# Patient Record
Sex: Male | Born: 1965 | Hispanic: Yes | Marital: Married | Smoking: Former smoker
Health system: Southern US, Community
[De-identification: ages and names within clinical notes are randomized; demographics above are authoritative.]

## PROBLEM LIST (undated history)

## (undated) DIAGNOSIS — E119 Type 2 diabetes mellitus without complications: Secondary | ICD-10-CM

## (undated) DIAGNOSIS — F419 Anxiety disorder, unspecified: Secondary | ICD-10-CM

## (undated) DIAGNOSIS — J020 Streptococcal pharyngitis: Secondary | ICD-10-CM

## (undated) DIAGNOSIS — M1990 Unspecified osteoarthritis, unspecified site: Secondary | ICD-10-CM

## (undated) DIAGNOSIS — R112 Nausea with vomiting, unspecified: Secondary | ICD-10-CM

## (undated) DIAGNOSIS — Z9889 Other specified postprocedural states: Secondary | ICD-10-CM

## (undated) DIAGNOSIS — G4730 Sleep apnea, unspecified: Secondary | ICD-10-CM

## (undated) HISTORY — PX: NECK SURGERY: SHX720

## (undated) HISTORY — PX: WISDOM TOOTH EXTRACTION: SHX21

## (undated) HISTORY — PX: HERNIA REPAIR: SHX51

---

## 2000-08-28 ENCOUNTER — Encounter: Payer: Self-pay | Admitting: Specialist

## 2000-08-28 ENCOUNTER — Ambulatory Visit (HOSPITAL_COMMUNITY): Admission: RE | Admit: 2000-08-28 | Discharge: 2000-08-28 | Payer: Self-pay | Admitting: Specialist

## 2011-08-26 ENCOUNTER — Other Ambulatory Visit (HOSPITAL_COMMUNITY): Payer: Self-pay | Admitting: Orthopaedic Surgery

## 2011-09-08 ENCOUNTER — Encounter (HOSPITAL_COMMUNITY): Payer: Self-pay | Admitting: Pharmacy Technician

## 2011-09-09 ENCOUNTER — Encounter (HOSPITAL_COMMUNITY): Payer: Self-pay

## 2011-09-09 ENCOUNTER — Encounter (HOSPITAL_COMMUNITY)
Admission: RE | Admit: 2011-09-09 | Discharge: 2011-09-09 | Disposition: A | Discharge status: Home / Self Care | Payer: 59 | Source: Ambulatory Visit | Attending: Orthopaedic Surgery | Admitting: Orthopaedic Surgery

## 2011-09-09 HISTORY — DX: Other specified postprocedural states: Z98.890

## 2011-09-09 HISTORY — DX: Unspecified osteoarthritis, unspecified site: M19.90

## 2011-09-09 HISTORY — DX: Nausea with vomiting, unspecified: R11.2

## 2011-09-09 LAB — URINALYSIS, ROUTINE W REFLEX MICROSCOPIC
Glucose, UA: NEGATIVE mg/dL
Hgb urine dipstick: NEGATIVE
Ketones, ur: NEGATIVE mg/dL
Protein, ur: NEGATIVE mg/dL
Urobilinogen, UA: 0.2 mg/dL (ref 0.0–1.0)

## 2011-09-09 LAB — CBC
HCT: 45.4 % (ref 39.0–52.0)
Hemoglobin: 16.1 g/dL (ref 13.0–17.0)
MCHC: 35.5 g/dL (ref 30.0–36.0)
MCV: 85.5 fL (ref 78.0–100.0)
WBC: 8.3 10*3/uL (ref 4.0–10.5)

## 2011-09-09 LAB — BASIC METABOLIC PANEL
BUN: 16 mg/dL (ref 6–23)
CO2: 27 mEq/L (ref 19–32)
Calcium: 9.8 mg/dL (ref 8.4–10.5)
Creatinine, Ser: 0.94 mg/dL (ref 0.50–1.35)
GFR calc non Af Amer: >90 mL/min (ref >90)
Glucose, Bld: 90 mg/dL (ref 70–99)

## 2011-09-09 LAB — APTT: aPTT: 35 seconds (ref 24–37)

## 2011-09-09 NOTE — Patient Instructions (Signed)
[address] T[address] Vences  09/09/2011   Your procedure is scheduled on:  09/19/2011 0730am-0900am  Report to [facility] at 0530 AM.  Call this number if you have problems the morning of surgery: [phone]   Remember:   Do not eat food:After Midnight.  May have clear liquids:until Midnight .    Take these medicines the morning of surgery with A SIP OF WATER:    Do not wear jewelry,   Do not wear lotions, powders, or perfumes. .    Do not bring valuables to the hospital.  Contacts, dentures or bridgework may not be worn into surgery.  Leave suitcase in the car. After surgery it may be brought to your room.  For patients admitted to the hospital, checkout time is 11:00 AM the day of discharge.      Special Instructions: CHG Shower Use Special Wash: 1/2 bottle night before surgery and 1/2 bottle morning of surgery. shower chin to toes  With CHG.  Wash face and private parts with regular soap.     Please read over the following fact sheets that you were given: MRSA Information, Blood Transfusion Fact Sheet, coughing and deep breathing exercises, leg exercises

## 2011-09-17 DIAGNOSIS — J020 Streptococcal pharyngitis: Secondary | ICD-10-CM

## 2011-09-17 HISTORY — DX: Streptococcal pharyngitis: J02.0

## 2011-09-19 ENCOUNTER — Encounter (HOSPITAL_COMMUNITY): Payer: Self-pay | Admitting: *Deleted

## 2011-09-19 ENCOUNTER — Ambulatory Visit (HOSPITAL_COMMUNITY): Payer: 59

## 2011-09-19 ENCOUNTER — Inpatient Hospital Stay (HOSPITAL_COMMUNITY)
Admission: RE | Admit: 2011-09-19 | Discharge: 2011-09-22 | DRG: 470 | Disposition: A | Discharge status: Home Health | Payer: 59 | Source: Ambulatory Visit | Attending: Orthopaedic Surgery | Admitting: Orthopaedic Surgery

## 2011-09-19 ENCOUNTER — Encounter (HOSPITAL_COMMUNITY): Payer: Self-pay | Admitting: Anesthesiology

## 2011-09-19 ENCOUNTER — Encounter (HOSPITAL_COMMUNITY): Payer: Self-pay

## 2011-09-19 ENCOUNTER — Ambulatory Visit (HOSPITAL_COMMUNITY): Payer: 59 | Admitting: Anesthesiology

## 2011-09-19 ENCOUNTER — Encounter (HOSPITAL_COMMUNITY)
Admission: RE | Disposition: A | Discharge status: Home Health | Payer: Self-pay | Source: Ambulatory Visit | Attending: Orthopaedic Surgery

## 2011-09-19 DIAGNOSIS — M1610 Unilateral primary osteoarthritis, unspecified hip: Principal | ICD-10-CM | POA: Diagnosis present

## 2011-09-19 DIAGNOSIS — J020 Streptococcal pharyngitis: Secondary | ICD-10-CM | POA: Diagnosis present

## 2011-09-19 DIAGNOSIS — F17200 Nicotine dependence, unspecified, uncomplicated: Secondary | ICD-10-CM | POA: Diagnosis present

## 2011-09-19 DIAGNOSIS — M169 Osteoarthritis of hip, unspecified: Secondary | ICD-10-CM

## 2011-09-19 DIAGNOSIS — Z01812 Encounter for preprocedural laboratory examination: Secondary | ICD-10-CM

## 2011-09-19 HISTORY — DX: Streptococcal pharyngitis: J02.0

## 2011-09-19 HISTORY — PX: TOTAL HIP ARTHROPLASTY: SHX124

## 2011-09-19 LAB — TYPE AND SCREEN
ABO/RH(D): O POS
Antibody Screen: NEGATIVE

## 2011-09-19 LAB — ABO/RH: ABO/RH(D): O POS

## 2011-09-19 SURGERY — ARTHROPLASTY, HIP, TOTAL, ANTERIOR APPROACH
Anesthesia: General | Site: Hip | Laterality: Left | Wound class: Clean

## 2011-09-19 MED ORDER — ONDANSETRON HCL 4 MG PO TABS
4.0000 mg | ORAL_TABLET | Freq: Four times a day (QID) | ORAL | Status: DC | PRN
  Filled 2011-09-19: qty 1

## 2011-09-19 MED ORDER — PROMETHAZINE HCL 25 MG/ML IJ SOLN: 6.2500 mg | INTRAMUSCULAR | Status: DC | PRN

## 2011-09-19 MED ORDER — ONDANSETRON HCL 4 MG/2ML IJ SOLN
INTRAMUSCULAR | Status: DC | PRN
  Administered 2011-09-19: 4 mg via INTRAVENOUS

## 2011-09-19 MED ORDER — HYDROMORPHONE HCL PF 1 MG/ML IJ SOLN
0.2500 mg | INTRAMUSCULAR | Status: DC | PRN
  Administered 2011-09-19 (×4): 0.5 mg via INTRAVENOUS

## 2011-09-19 MED ORDER — SODIUM CHLORIDE 0.9 % IV SOLN
INTRAVENOUS | Status: DC
  Administered 2011-09-19 – 2011-09-20 (×3): via INTRAVENOUS

## 2011-09-19 MED ORDER — ALUM & MAG HYDROXIDE-SIMETH 200-200-20 MG/5ML PO SUSP
30.0000 mL | ORAL | Status: DC | PRN
  Filled 2011-09-19: qty 30

## 2011-09-19 MED ORDER — FENTANYL CITRATE 0.05 MG/ML IJ SOLN
INTRAMUSCULAR | Status: DC | PRN
  Administered 2011-09-19: 100 ug via INTRAVENOUS
  Administered 2011-09-19 (×2): 50 ug via INTRAVENOUS

## 2011-09-19 MED ORDER — RIVAROXABAN 10 MG PO TABS
10.0000 mg | ORAL_TABLET | Freq: Every day | ORAL | Status: DC
  Administered 2011-09-20 – 2011-09-22 (×3): 10 mg via ORAL
  Filled 2011-09-19 (×3): qty 1

## 2011-09-19 MED ORDER — ACETAMINOPHEN 650 MG RE SUPP: 650.0000 mg | Freq: Four times a day (QID) | RECTAL | Status: DC | PRN

## 2011-09-19 MED ORDER — MORPHINE SULFATE (PF) 1 MG/ML IV SOLN
INTRAVENOUS | Status: DC
  Administered 2011-09-19: 11:00:00 via INTRAVENOUS
  Administered 2011-09-19: 1.5 mg via INTRAVENOUS
  Administered 2011-09-20: 3 mg via INTRAVENOUS
  Administered 2011-09-20: 09:00:00 via INTRAVENOUS
  Administered 2011-09-20: 9 mg via INTRAVENOUS
  Administered 2011-09-20 (×2): 3 mg via INTRAVENOUS
  Filled 2011-09-19: qty 25

## 2011-09-19 MED ORDER — ACETAMINOPHEN 325 MG PO TABS: 650.0000 mg | ORAL_TABLET | Freq: Four times a day (QID) | ORAL | Status: DC | PRN

## 2011-09-19 MED ORDER — LORATADINE 10 MG PO TABS
10.0000 mg | ORAL_TABLET | Freq: Every day | ORAL | Status: DC
  Administered 2011-09-19 – 2011-09-22 (×4): 10 mg via ORAL
  Filled 2011-09-19 (×4): qty 1

## 2011-09-19 MED ORDER — DIPHENHYDRAMINE HCL 12.5 MG/5ML PO ELIX
12.5000 mg | ORAL_SOLUTION | Freq: Four times a day (QID) | ORAL | Status: DC | PRN
  Filled 2011-09-19: qty 5

## 2011-09-19 MED ORDER — CISATRACURIUM BESYLATE 2 MG/ML IV SOLN
INTRAVENOUS | Status: DC | PRN
  Administered 2011-09-19: 6 mg via INTRAVENOUS

## 2011-09-19 MED ORDER — DOCUSATE SODIUM 100 MG PO CAPS
100.0000 mg | ORAL_CAPSULE | Freq: Two times a day (BID) | ORAL | Status: DC
  Administered 2011-09-20 – 2011-09-22 (×4): 100 mg via ORAL
  Filled 2011-09-19 (×7): qty 1

## 2011-09-19 MED ORDER — METHOCARBAMOL 100 MG/ML IJ SOLN
500.0000 mg | Freq: Four times a day (QID) | INTRAVENOUS | Status: DC | PRN
  Administered 2011-09-19 – 2011-09-20 (×2): 500 mg via INTRAVENOUS
  Filled 2011-09-19 (×2): qty 5

## 2011-09-19 MED ORDER — OXYCODONE HCL 5 MG PO TABS: 5.0000 mg | ORAL_TABLET | ORAL | Status: DC | PRN

## 2011-09-19 MED ORDER — DIPHENHYDRAMINE HCL 50 MG/ML IJ SOLN: 12.5000 mg | Freq: Four times a day (QID) | INTRAMUSCULAR | Status: DC | PRN

## 2011-09-19 MED ORDER — ACETAMINOPHEN 10 MG/ML IV SOLN
INTRAVENOUS | Status: AC
  Filled 2011-09-19: qty 100

## 2011-09-19 MED ORDER — LACTATED RINGERS IV SOLN: INTRAVENOUS | Status: DC

## 2011-09-19 MED ORDER — 0.9 % SODIUM CHLORIDE (POUR BTL) OPTIME
TOPICAL | Status: DC | PRN
  Administered 2011-09-19: 1000 mL

## 2011-09-19 MED ORDER — LACTATED RINGERS IV SOLN
INTRAVENOUS | Status: DC | PRN
  Administered 2011-09-19 (×3): via INTRAVENOUS

## 2011-09-19 MED ORDER — ONDANSETRON HCL 4 MG/2ML IJ SOLN: 4.0000 mg | Freq: Four times a day (QID) | INTRAMUSCULAR | Status: DC | PRN

## 2011-09-19 MED ORDER — SUCCINYLCHOLINE CHLORIDE 20 MG/ML IJ SOLN
INTRAMUSCULAR | Status: DC | PRN
  Administered 2011-09-19: 100 mg via INTRAVENOUS

## 2011-09-19 MED ORDER — MORPHINE SULFATE (PF) 1 MG/ML IV SOLN
INTRAVENOUS | Status: AC
  Filled 2011-09-19: qty 25

## 2011-09-19 MED ORDER — AZITHROMYCIN 250 MG PO TABS
250.0000 mg | ORAL_TABLET | Freq: Every day | ORAL | Status: DC
  Administered 2011-09-19 – 2011-09-22 (×4): 250 mg via ORAL
  Filled 2011-09-19 (×4): qty 1

## 2011-09-19 MED ORDER — NALOXONE HCL 0.4 MG/ML IJ SOLN: 0.4000 mg | INTRAMUSCULAR | Status: DC | PRN

## 2011-09-19 MED ORDER — EPHEDRINE SULFATE 50 MG/ML IJ SOLN
INTRAMUSCULAR | Status: DC | PRN
  Administered 2011-09-19: 10 mg via INTRAVENOUS

## 2011-09-19 MED ORDER — ACETAMINOPHEN 10 MG/ML IV SOLN
INTRAVENOUS | Status: DC | PRN
  Administered 2011-09-19: 1000 mg via INTRAVENOUS

## 2011-09-19 MED ORDER — METOCLOPRAMIDE HCL 10 MG PO TABS: 5.0000 mg | ORAL_TABLET | Freq: Three times a day (TID) | ORAL | Status: DC | PRN

## 2011-09-19 MED ORDER — MIDAZOLAM HCL 5 MG/5ML IJ SOLN
INTRAMUSCULAR | Status: DC | PRN
  Administered 2011-09-19: 2 mg via INTRAVENOUS

## 2011-09-19 MED ORDER — HETASTARCH-ELECTROLYTES 6 % IV SOLN
INTRAVENOUS | Status: DC | PRN
  Administered 2011-09-19: 09:00:00 via INTRAVENOUS

## 2011-09-19 MED ORDER — ATORVASTATIN CALCIUM 40 MG PO TABS
40.0000 mg | ORAL_TABLET | Freq: Every day | ORAL | Status: DC
  Administered 2011-09-19 – 2011-09-21 (×3): 40 mg via ORAL
  Filled 2011-09-19 (×4): qty 1

## 2011-09-19 MED ORDER — CEFAZOLIN SODIUM-DEXTROSE 2-3 GM-% IV SOLR
INTRAVENOUS | Status: AC
  Filled 2011-09-19: qty 50

## 2011-09-19 MED ORDER — MORPHINE SULFATE 2 MG/ML IJ SOLN
1.0000 mg | INTRAMUSCULAR | Status: DC | PRN
Start: 2011-09-19 — End: 2011-09-21
  Administered 2011-09-19: 1 mg via INTRAVENOUS
  Filled 2011-09-19: qty 1

## 2011-09-19 MED ORDER — PROPOFOL 10 MG/ML IV BOLUS
INTRAVENOUS | Status: DC | PRN
  Administered 2011-09-19: 150 mg via INTRAVENOUS

## 2011-09-19 MED ORDER — PHENOL 1.4 % MT LIQD: 1.0000 | OROMUCOSAL | Status: DC | PRN

## 2011-09-19 MED ORDER — VITAMIN D3 25 MCG (1000 UNIT) PO TABS
1000.0000 [IU] | ORAL_TABLET | Freq: Every day | ORAL | Status: DC
  Administered 2011-09-19 – 2011-09-22 (×4): 1000 [IU] via ORAL
  Filled 2011-09-19 (×4): qty 1

## 2011-09-19 MED ORDER — ZOLPIDEM TARTRATE 5 MG PO TABS
5.0000 mg | ORAL_TABLET | Freq: Every evening | ORAL | Status: DC | PRN
  Administered 2011-09-19: 5 mg via ORAL
  Filled 2011-09-19: qty 1

## 2011-09-19 MED ORDER — CEFAZOLIN SODIUM-DEXTROSE 2-3 GM-% IV SOLR: 2.0000 g | INTRAVENOUS | Status: DC

## 2011-09-19 MED ORDER — HYDROMORPHONE HCL PF 1 MG/ML IJ SOLN
INTRAMUSCULAR | Status: AC
  Filled 2011-09-19: qty 1

## 2011-09-19 MED ORDER — DEXAMETHASONE SODIUM PHOSPHATE 4 MG/ML IJ SOLN
INTRAMUSCULAR | Status: DC | PRN
  Administered 2011-09-19: 10 mg via INTRAVENOUS

## 2011-09-19 MED ORDER — CEFAZOLIN SODIUM 1-5 GM-% IV SOLN
1.0000 g | Freq: Four times a day (QID) | INTRAVENOUS | Status: AC
  Administered 2011-09-19 – 2011-09-20 (×3): 1 g via INTRAVENOUS
  Filled 2011-09-19 (×3): qty 50

## 2011-09-19 MED ORDER — METHOCARBAMOL 500 MG PO TABS
500.0000 mg | ORAL_TABLET | Freq: Four times a day (QID) | ORAL | Status: DC | PRN
  Administered 2011-09-19 – 2011-09-20 (×2): 500 mg via ORAL
  Filled 2011-09-19 (×2): qty 1

## 2011-09-19 MED ORDER — METOCLOPRAMIDE HCL 5 MG/ML IJ SOLN
5.0000 mg | Freq: Three times a day (TID) | INTRAMUSCULAR | Status: DC | PRN
  Filled 2011-09-19: qty 2

## 2011-09-19 MED ORDER — ADULT MULTIVITAMIN W/MINERALS CH
1.0000 | ORAL_TABLET | Freq: Every day | ORAL | Status: DC
  Administered 2011-09-19 – 2011-09-22 (×4): 1 via ORAL
  Filled 2011-09-19 (×4): qty 1

## 2011-09-19 MED ORDER — DROPERIDOL 2.5 MG/ML IJ SOLN
INTRAMUSCULAR | Status: DC | PRN
  Administered 2011-09-19: 0.625 mg via INTRAVENOUS

## 2011-09-19 MED ORDER — HYDROCODONE-ACETAMINOPHEN 5-325 MG PO TABS
1.0000 | ORAL_TABLET | ORAL | Status: DC | PRN
  Administered 2011-09-20 – 2011-09-22 (×11): 2 via ORAL
  Filled 2011-09-19 (×12): qty 2

## 2011-09-19 MED ORDER — MENTHOL 3 MG MT LOZG: 1.0000 | LOZENGE | OROMUCOSAL | Status: DC | PRN

## 2011-09-19 MED ORDER — DIPHENHYDRAMINE HCL 12.5 MG/5ML PO ELIX
12.5000 mg | ORAL_SOLUTION | ORAL | Status: DC | PRN
  Filled 2011-09-19 (×2): qty 10

## 2011-09-19 MED ORDER — ONDANSETRON HCL 4 MG/2ML IJ SOLN
4.0000 mg | Freq: Four times a day (QID) | INTRAMUSCULAR | Status: DC | PRN
  Filled 2011-09-19: qty 2

## 2011-09-19 MED ORDER — HYDROMORPHONE HCL PF 1 MG/ML IJ SOLN
INTRAMUSCULAR | Status: DC | PRN
  Administered 2011-09-19: 1 mg via INTRAVENOUS
  Administered 2011-09-19 (×2): 0.5 mg via INTRAVENOUS

## 2011-09-19 MED ORDER — LIDOCAINE HCL (CARDIAC) 20 MG/ML IV SOLN
INTRAVENOUS | Status: DC | PRN
  Administered 2011-09-19: 100 mg via INTRAVENOUS

## 2011-09-19 MED ORDER — SODIUM CHLORIDE 0.9 % IJ SOLN: 9.0000 mL | INTRAMUSCULAR | Status: DC | PRN

## 2011-09-19 SURGICAL SUPPLY — 34 items
BAG ZIPLOCK 12X15 (MISCELLANEOUS) ×2 IMPLANT
BLADE SAW SGTL 18X1.27X75 (BLADE) ×2 IMPLANT
CELLS DAT CNTRL 66122 CELL SVR (MISCELLANEOUS) ×1 IMPLANT
CLOTH BEACON ORANGE TIMEOUT ST (SAFETY) ×2 IMPLANT
DRAPE C-ARM 42X72 X-RAY (DRAPES) ×2 IMPLANT
DRAPE STERI IOBAN 125X83 (DRAPES) ×2 IMPLANT
DRAPE U-SHAPE 47X51 STRL (DRAPES) ×6 IMPLANT
DRSG MEPILEX BORDER 4X8 (GAUZE/BANDAGES/DRESSINGS) ×2 IMPLANT
DURAPREP 26ML APPLICATOR (WOUND CARE) ×2 IMPLANT
ELECT BLADE TIP CTD 4 INCH (ELECTRODE) ×2 IMPLANT
ELECT REM PT RETURN 9FT ADLT (ELECTROSURGICAL) ×2
ELECTRODE REM PT RTRN 9FT ADLT (ELECTROSURGICAL) ×1 IMPLANT
FACESHIELD LNG OPTICON STERILE (SAFETY) ×6 IMPLANT
GAUZE XEROFORM 1X8 LF (GAUZE/BANDAGES/DRESSINGS) ×2 IMPLANT
GLOVE BIO SURGEON STRL SZ7 (GLOVE) ×2 IMPLANT
GLOVE BIO SURGEON STRL SZ7.5 (GLOVE) ×2 IMPLANT
GLOVE BIOGEL PI IND STRL 7.5 (GLOVE) IMPLANT
GLOVE BIOGEL PI IND STRL 8 (GLOVE) ×1 IMPLANT
GLOVE BIOGEL PI INDICATOR 7.5 (GLOVE)
GLOVE BIOGEL PI INDICATOR 8 (GLOVE) ×1
GLOVE ECLIPSE 7.0 STRL STRAW (GLOVE) ×2 IMPLANT
GOWN STRL REIN XL XLG (GOWN DISPOSABLE) ×4 IMPLANT
KIT BASIN OR (CUSTOM PROCEDURE TRAY) ×2 IMPLANT
PACK TOTAL JOINT (CUSTOM PROCEDURE TRAY) ×2 IMPLANT
PADDING CAST COTTON 6X4 STRL (CAST SUPPLIES) ×2 IMPLANT
RTRCTR WOUND ALEXIS 18CM MED (MISCELLANEOUS) ×2
STAPLER SKIN PROX WIDE 3.9 (STAPLE) ×2 IMPLANT
SUT ETHIBOND NAB CT1 #1 30IN (SUTURE) ×2 IMPLANT
SUT VIC AB 1 CT1 36 (SUTURE) ×2 IMPLANT
SUT VIC AB 2-0 CT1 27 (SUTURE) ×2
SUT VIC AB 2-0 CT1 TAPERPNT 27 (SUTURE) ×2 IMPLANT
TOWEL OR 17X26 10 PK STRL BLUE (TOWEL DISPOSABLE) ×4 IMPLANT
TOWEL OR NON WOVEN STRL DISP B (DISPOSABLE) ×2 IMPLANT
TRAY FOLEY CATH 14FRSI W/METER (CATHETERS) ×2 IMPLANT

## 2011-09-19 NOTE — H&P (Signed)
[name] is an 46 y.o. male.   Chief Complaint:   Left hip pain; known OA HPI:   46 yo male with worsening left hip pain for sometime now.  X-rays show severe OA.  His ROM has been limited and his pain is on a daily basis.  He now wishes to proceed with a total hip replacement.  He understands the risks and benefits of surgery fully.  He does have a recent history of a sore throat and apparently a positive strep test 2 days after a swab.  He has been on antibiotics for 2 days and is afebrile with no symptoms today.  There is an extremely low risk to his hip according to a literature review.  I discussed this with him in detail.  Past Medical History  Diagnosis Date  . PONV (postoperative nausea and vomiting)   . Arthritis     bilateral hips  . Strep throat 09/17/11    on Zpac    Past Surgical History  Procedure Date  . Hernia repair     left inguinal   . Wisdom tooth extraction     History reviewed. No pertinent family history. Social History:  reports that he has been smoking Cigarettes.  He has smoked for the past 20 years. He has never used smokeless tobacco. He reports that he drinks about 1.8 ounces of alcohol per week. He reports that he does not use illicit drugs.  Allergies:  Allergies  Allergen Reactions  . Amoxicillin (Amoxil) Diarrhea and Nausea And Vomiting    Medications Prior to Admission  Medication Dose Route Frequency Provider Last Rate Last Dose  . ceFAZolin (ANCEF) IVPB 2 g/50 mL premix  2 g Intravenous 60 min Pre-Op Kathryne Hitch, MD       Medications Prior to Admission  Medication Sig Dispense Refill  . azithromycin (ZITHROMAX) 250 MG tablet Take 250 mg by mouth daily.        Results for orders placed during the hospital encounter of 09/19/11 (from the past 48 hour(s))  TYPE AND SCREEN     Status: Normal (Preliminary result)   Collection Time   09/19/11  6:00 AM      Component Value Range Comment   ABO/RH(D) O POS      Antibody Screen  PENDING      Sample Expiration 09/22/2011      No results found.  Review of Systems  HENT: Positive for sore throat.     Blood pressure 135/88, pulse 82, temperature 97.2 F (36.2 C), resp. rate 20, SpO2 95.00%. Physical Exam  Constitutional: He is oriented to person, place, and time. He appears well-developed and well-nourished.  HENT:  Head: Normocephalic and atraumatic.  Eyes: EOM are normal. Pupils are equal, round, and reactive to light.  Neck: Normal range of motion. Neck supple.  Cardiovascular: Normal rate and regular rhythm.   Respiratory: Effort normal and breath sounds normal.  GI: Soft. Bowel sounds are normal.  Musculoskeletal:       Left hip: He exhibits bony tenderness and crepitus.  Neurological: He is alert and oriented to person, place, and time.  Skin: Skin is warm and dry.  Psychiatric: He has a normal mood and affect.     Assessment/Plan To the OR today for a left total hip replacement.  [name] Y 09/19/2011, 7:04 AM

## 2011-09-19 NOTE — Progress Notes (Signed)
Dr. Magnus Ivan in- made aware of being unable to detect left dorsalis pedis pulse but that doppler pulses were present left anterior tib- and posterior tib

## 2011-09-19 NOTE — Anesthesia Postprocedure Evaluation (Signed)
  Anesthesia Post-op Note  Patient: [name]  Procedure(s) Performed: Procedure(s) (LRB): TOTAL HIP ARTHROPLASTY ANTERIOR APPROACH (Left)  Patient Location: PACU  Anesthesia Type: General  Level of Consciousness: awake and alert   Airway and Oxygen Therapy: Patient Spontanous Breathing  Post-op Pain: mild  Post-op Assessment: Post-op Vital signs reviewed, Patient's Cardiovascular Status Stable, Respiratory Function Stable, Patent Airway and No signs of Nausea or vomiting  Post-op Vital Signs: stable  Complications: No apparent anesthesia complications

## 2011-09-19 NOTE — Progress Notes (Signed)
X-RAY RESULTS NOTED. 

## 2011-09-19 NOTE — Anesthesia Preprocedure Evaluation (Addendum)
Anesthesia Evaluation  Patient identified by MRN, date of birth, ID band Patient awake    Reviewed: Allergy & Precautions, H&P , NPO status , Patient's Chart, lab work & pertinent test results  Airway Mallampati: II TM Distance: >3 FB Neck ROM: Full    Dental No notable dental hx.    Pulmonary neg pulmonary ROS,  clear to auscultation  Pulmonary exam normal       Cardiovascular neg cardio ROS Regular Normal    Neuro/Psych Negative Neurological ROS  Negative Psych ROS   GI/Hepatic negative GI ROS, Neg liver ROS,   Endo/Other  Negative Endocrine ROS  Renal/GU negative Renal ROS  Genitourinary negative   Musculoskeletal negative musculoskeletal ROS (+)   Abdominal   Peds negative pediatric ROS (+)  Hematology negative hematology ROS (+)   Anesthesia Other Findings   Reproductive/Obstetrics negative OB ROS                           Anesthesia Physical Anesthesia Plan  ASA: II  Anesthesia Plan: General   Post-op Pain Management:    Induction: Intravenous  Airway Management Planned: Oral ETT  Additional Equipment:   Intra-op Plan:   Post-operative Plan: Extubation in OR  Informed Consent: I have reviewed the patients History and Physical, chart, labs and discussed the procedure including the risks, benefits and alternatives for the proposed anesthesia with the patient or authorized representative who has indicated his/her understanding and acceptance.   Dental advisory given  Plan Discussed with: CRNA  Anesthesia Plan Comments:         Anesthesia Quick Evaluation  

## 2011-09-19 NOTE — Progress Notes (Signed)
Portable ap pelvis and lateral left hip x-rays done

## 2011-09-19 NOTE — Addendum Note (Signed)
Addendum  created 09/19/11 1152 by Lattie Haw, CRNA   Modules edited:Anesthesia LDA

## 2011-09-19 NOTE — Brief Op Note (Signed)
09/19/2011  10:25 AM  PATIENT:  [name]  46 y.o. male  PRE-OPERATIVE DIAGNOSIS:  Severe osteoarthritis left hip  POST-OPERATIVE DIAGNOSIS:  Severe osteoarthritis left hip  PROCEDURE:  Procedure(s) (LRB): TOTAL HIP ARTHROPLASTY ANTERIOR APPROACH (Left)  SURGEON:  Surgeon(s) and Role:    * Kathryne Hitch, MD - Primary  PHYSICIAN ASSISTANT:   ASSISTANTS: none   ANESTHESIA:   general  EBL:  Total I/O In: 2800 [I.V.:2800] Out: 1100 [Urine:300; Blood:800]  BLOOD ADMINISTERED:none  DRAINS: none   LOCAL MEDICATIONS USED:  NONE  SPECIMEN:  No Specimen  DISPOSITION OF SPECIMEN:  N/A  COUNTS:  YES  TOURNIQUET:  * No tourniquets in log *  DICTATION: .Other Dictation: Dictation Number [id number]  PLAN OF CARE: Admit to inpatient   PATIENT DISPOSITION:  PACU - hemodynamically stable.   Delay start of Pharmacological VTE agent (>24hrs) due to surgical blood loss or risk of bleeding: yes

## 2011-09-19 NOTE — Transfer of Care (Signed)
Immediate Anesthesia Transfer of Care Note  Patient: [name]  Procedure(s) Performed: Procedure(s) (LRB): TOTAL HIP ARTHROPLASTY ANTERIOR APPROACH (Left)  Patient Location: PACU  Anesthesia Type: General  Level of Consciousness: sedated  Airway & Oxygen Therapy: Patient Spontanous Breathing and Patient connected to face mask oxygen  Post-op Assessment: Report given to PACU RN  Post vital signs: Reviewed and stable  Complications: No apparent anesthesia complications

## 2011-09-19 NOTE — Progress Notes (Addendum)
Spoke w Dr Maureen Ralphs RE: pt w strep throat. Has been on ATBX since wed 09-17-11

## 2011-09-19 NOTE — Addendum Note (Signed)
Addendum  created 09/19/11 1211 by Aftin Lye K. Patrece Tallie, CRNA   Modules edited:Anesthesia LDA    

## 2011-09-19 NOTE — Addendum Note (Signed)
Addendum  created 09/19/11 1211 by Ricki Rodriguez. Grantham Hippert, CRNA   Modules edited:Anesthesia LDA

## 2011-09-19 NOTE — Addendum Note (Signed)
Addendum  created 09/19/11 1153 by Lattie Haw, CRNA   Modules edited:Anesthesia LDA

## 2011-09-20 LAB — BASIC METABOLIC PANEL
BUN: 14 mg/dL (ref 6–23)
CO2: 26 mEq/L (ref 19–32)
Calcium: 8.4 mg/dL (ref 8.4–10.5)
GFR calc non Af Amer: >90 mL/min (ref >90)
Glucose, Bld: 166 mg/dL — ABNORMAL HIGH (ref 70–99)
Sodium: 134 mEq/L — ABNORMAL LOW (ref 135–145)

## 2011-09-20 LAB — CBC
HCT: 32.2 % — ABNORMAL LOW (ref 39.0–52.0)
Hemoglobin: 11 g/dL — ABNORMAL LOW (ref 13.0–17.0)
MCH: 29.6 pg (ref 26.0–34.0)
MCHC: 34.2 g/dL (ref 30.0–36.0)
RBC: 3.71 MIL/uL — ABNORMAL LOW (ref 4.22–5.81)

## 2011-09-20 NOTE — Op Note (Signed)
[name], [name] NO.:  [id number]  MEDICAL RECORD NO.:  [id number]  LOCATION:  1607                         FACILITY:  [facility]  PHYSICIAN:  Vanita Panda. Magnus Ivan, M.D.DATE OF BIRTH:  12-11-1965  DATE OF PROCEDURE:  09/19/2011 DATE OF DISCHARGE:                              OPERATIVE REPORT   PREOPERATIVE DIAGNOSIS:  End-stage osteoarthritis and degenerative joint disease, left hip.  POSTOPERATIVE DIAGNOSIS:  End-stage osteoarthritis and degenerative joint disease, left hip.  PROCEDURE:  Left total hip arthroplasty through direct anterior approach.  IMPLANTS:  DePuy Sector Gription acetabular component size 50, size 32 + 4 neutral polyethylene liner, size 9 Corail femoral component (KLA), size 32 + 1 ceramic hip ball.  SURGEON:  Vanita Panda. Magnus Ivan, M.D.  ANESTHESIA:  General.  ANTIBIOTICS:  2 g IV Ancef.  BLOOD LOSS:  800 mL.  COMPLICATIONS:  None.  INDICATIONS:  [name] is a 46 year old patient well known to me.  He has had left hip pain for a long period of time.  Social rotating, pivoting, and other activities.  It hurts on a daily basis, and even throbs at night.  X-rays were eventually obtained of his hip and it showed surprisingly significant arthritic changes in the hip with multiple cystic changes in the femoral head and the acetabulum. Osteophytes throughout the femoral head and acetabulum as well.  This is basically end-stage arthritis.  Due to effects on his activities of daily living and his mobility, he wished to proceed with a total hip arthroplasty.  He failed conservative treatment.  DESCRIPTION OF PROCEDURE:  After informed consent was obtained, appropriate left hip was marked.  He was brought to the operating room. General anesthesia was obtained while he was on a stretcher.  Foley catheter was then placed once  general anesthesia was placed and then traction boots were placed on both his feet.  He was then placed  supine on the HANA fracture table with a perineal post in place and both legs in inline skeletal traction, but no traction applied.  His left hip was then prepped and draped with DuraPrep and sterile drapes.  Time-out was called and he was identified as the correct patient and correct left hip.  I did anterior approach to the hip with the incision just inferior and posterior to the anterosuperior iliac spine and carried this obliquely down the leg.  I dissected down to the tensor fascia lata which I then divided longitudinally.  I then proceeded with a direct anterior approach to the hip.  Cobra retractor was placed around the medial and lateral neck.  I coagulated the lateral femoral circumflex vessels.  I then made my femoral neck cut because of how tight he was. This was at the  level of the lesser trochanter and the calcar.  I then cleaned the acetabulum of debris and a bent Hohmann was placed medially as well as the retractor laterally.  I began reaming from a size 42 reamer in 2 mm increments, all the way up to a size 50 and surprisingly for such a big guy.  Under direct fluoroscopic guidance, you could see__________ and you could see that he only needed up to size 50 acetabular  component.  Once I felt like the reaming was adequate and the depth was adequate, I placed a real 50 acetabular component size, which is the [employer] from [employer].  I knocked this part into place and it did not move and did not need screw holes.  I then placed the real 32 + 4 neutral polyethylene liner.  Attention was then turned to the femur.  No traction was placed on the leg, it was externally rotated to 90 degrees, extended and adducted.  I placed a temporary hook underneath the vastus ridge of the femur, lifted this up and then released some soft tissue around the greater trochanter.  We then began broaching from a size 8 broach, only to a size 9 broach, which was surprisingly very tight.  I  then trialed a high offset neck, given the anatomy of his pelvis.  I placed a 32 + 1 hip ball in this.  He had minimal shuck and it felt like his leg lengths were equal.  I then removed all trial instrumentation and placed a real HA-coated femoral component size 9 with varus offset.  I then placed the real 32 + 1 hip ball, reduced this back into the acetabulum and put it in through internal and external rotation.  Flexion, extension were stable.  I was pleased at his leg length as well.  I then copiously irrigated the deep tissue with normal saline solution.  I closed the joint capsule with #1 Ethibond followed by #1 Vicryl in the tensor fascia lata, 2-0 Vicryl on the subcutaneous tissue, and staples on the skin.  He was then awakened, extubated, and taken to recovery room in stable condition.  All final counts were correct.  There were no complications noted.     Vanita Panda. Magnus Ivan, M.D.     CYB/MEDQ  D:  09/19/2011  T:  09/20/2011  Job:  [id number]

## 2011-09-20 NOTE — Progress Notes (Signed)
Physical Therapy Treatment Patient Details Name: [name] MRN: [id number] DOB: [birth year] Today's Date: 09/20/2011  PT Assessment/Plan  PT - Assessment/Plan Comments on Treatment Session: Pt reports pain better; recliner switched out due to pt's chair broken; pt stood x to allow hip extension/stretch; reports feeling better after standing stretch;  No dizziness with standing this pm PT Plan: Discharge plan remains appropriate;Frequency remains appropriate PT Frequency: 7X/week Recommendations for Other Services: OT consult Follow Up Recommendations: Home health PT Equipment Recommended: Standard walker;3 in 1 bedside comode PT Goals  Acute Rehab PT Goals PT Goal Formulation: With patient/family Time For Goal Achievement: 7 days Pt will go Supine/Side to Sit: with supervision;with HOB 0 degrees PT Goal: Supine/Side to Sit - Progress: Goal set today Pt will go Sit to Supine/Side: with supervision;with HOB 0 degrees PT Goal: Sit to Supine/Side - Progress: Progressing toward goal Pt will go Sit to Stand: with supervision PT Goal: Sit to Stand - Progress: Progressing toward goal Pt will go Stand to Sit: with supervision PT Goal: Stand to Sit - Progress: Progressing toward goal Pt will Ambulate: >150 feet;with supervision;with rolling walker PT Goal: Ambulate - Progress: Progressing toward goal (limited due to fatigue and pain thsi pm) Pt will Perform Home Exercise Program: with supervision, verbal cues required/provided PT Goal: Perform Home Exercise Program - Progress: Goal set today  PT Treatment Precautions/Restrictions  Restrictions Weight Bearing Restrictions: No WBAT  Mobility (including Balance) Bed Mobility Bed Mobility: Yes  Sit to Supine: 4: Min assist;HOB flat Sit to Supine - Details (indicate cue type and reason): min with LLE Transfers Transfers: Yes Sit to Stand: From chair/3-in-1;With upper extremity assist;4: Min assist Sit to Stand Details (indicate  cue type and reason): cues for hands and LLE position Stand to Sit: 4: Min assist;To elevated surface;To bed;With upper extremity assist Stand to Sit Details: vc to place LLE forward, required manual assist to advance LLE. Ambulation/Gait Ambulation/Gait: Yes Ambulation/Gait Assistance: 4: Min assist Ambulation/Gait Assistance Details (indicate cue type and reason): assist to advance LLE, min/guard foor balance Ambulation Distance (Feet): 6 Feet Assistive device: Rolling walker Gait Pattern: Step-to pattern;Decreased step length - left Gait velocity: slow     End of Session PT - End of Session Equipment Utilized During Treatment:  (sheet to hlpl move LLE on bed and in chaie) Activity Tolerance: Patient limited by fatigue Patient left: in bed;with call bell in reach;with family/visitor present Nurse Communication: Mobility status for transfers General Behavior During Session: [facility] for tasks performed Cognition: [facility] for tasks performed  [facility] 09/20/2011, 2:23 PM

## 2011-09-20 NOTE — Evaluation (Signed)
Physical Therapy Evaluation Patient Details Name: [name] MRN: [id number] DOB: 1966-02-11 Today's Date: 09/20/2011  Problem List:  Patient Active Problem List  Diagnoses  . Degenerative arthritis of hip    Past Medical History:  Past Medical History  Diagnosis Date  . PONV (postoperative nausea and vomiting)   . Arthritis     bilateral hips  . Strep throat 09/17/11    on Zpac   Past Surgical History:  Past Surgical History  Procedure Date  . Hernia repair     left inguinal   . Wisdom tooth extraction     PT Assessment/Plan/Recommendation PT Assessment Clinical Impression Statement: pt s/p direct Ant LTHAw/ hypotension after getting up to stand. pt will benefit from PT to improve in functional mobility, rom, strength to DC to home. PT Recommendation/Assessment: Patient will need skilled PT in the acute care venue PT Problem List: Decreased strength;Decreased range of motion;Decreased activity tolerance;Decreased knowledge of use of DME;Cardiopulmonary status limiting activity;Pain PT Therapy Diagnosis : Difficulty walking;Acute pain PT Plan PT Frequency: 7X/week PT Treatment/Interventions: DME instruction;Gait training;Functional mobility training;Therapeutic activities;Therapeutic exercise;Patient/family education PT Recommendation Recommendations for Other Services: OT consult Follow Up Recommendations: Home health PT;Supervision/Assistance - 24 hour Equipment Recommended: Standard walker;3 in 1 bedside comode PT Goals  Acute Rehab PT Goals PT Goal Formulation: With patient/family Time For Goal Achievement: 7 days Pt will go Supine/Side to Sit: with supervision;with HOB 0 degrees PT Goal: Supine/Side to Sit - Progress: Goal set today Pt will go Sit to Supine/Side: with supervision;with HOB 0 degrees PT Goal: Sit to Supine/Side - Progress: Goal set today Pt will go Sit to Stand: with supervision PT Goal: Sit to Stand - Progress: Goal set today Pt will go Stand  to Sit: with supervision PT Goal: Stand to Sit - Progress: Goal set today Pt will Ambulate: >150 feet;with supervision;with rolling walker PT Goal: Ambulate - Progress: Goal set today Pt will Perform Home Exercise Program: with supervision, verbal cues required/provided PT Goal: Perform Home Exercise Program - Progress: Goal set today  PT Evaluation Precautions/Restrictions    Prior Functioning  Home Living Lives With: Spouse Home Layout: One level Home Access: Level entry Home Adaptive Equipment: Crutches Additional Comments: pt states his bed is very low Prior Function Level of Independence: Independent with basic ADLs Cognition Cognition Arousal/Alertness: Awake/alert Sensation/Coordination Sensation Light Touch: Appears Intact Extremity Assessment RLE Assessment RLE Assessment: Within Functional Limits LLE Assessment LLE Assessment: Exceptions to [facility] LLE Strength LLE Overall Strength Comments: unable to flex hip w/out assist, nor advance LLE , used sheet to help move leg in bed. Mobility (including Balance) Bed Mobility Bed Mobility: Yes Supine to Sit: 1: +2 Total assist;HOB elevated (Comment degrees);With rails Supine to Sit Details (indicate cue type and reason): vc to turn trunk, legs to facilitated getting to EOb.pt used rail. Transfers Transfers: Yes Sit to Stand: 1: +2 Total assist;From elevated surface;With upper extremity assist;From bed Sit to Stand Details (indicate cue type and reason): vc to push from bed/RW  Stand to Sit: 1: +2 Total assist;To chair/3-in-1;With upper extremity assist;With armrests Stand to Sit Details: vc to place LLE forward, required manual assist to advance LLE. Ambulation/Gait Ambulation/Gait: Yes Ambulation/Gait Assistance: 1: +2 Total assist Ambulation/Gait Assistance Details (indicate cue type and reason): pt unable to advance LLE first, did netter if RLE led first. pt became very dizzy so chair brought up. Ambulation Distance  (Feet): 6 Feet Assistive device: Rolling walker Gait Pattern: Step-to pattern;Decreased step length - left Gait velocity: slow  Exercise  Total Joint Exercises Ankle Circles/Pumps: Left;10 reps;Supine;AROM Heel Slides: AAROM;Left;15 reps;Supine Hip ABduction/ADduction: AAROM;Left;10 reps;Supine End of Session PT - End of Session Equipment Utilized During Treatment:  (sheet to help move LLE on bed and in chair) Activity Tolerance: Treatment limited secondary to medical complications (Comment) (low BP) Patient left: in chair;with call bell in reach;with family/visitor present Nurse Communication: Mobility status for transfers (BP 90/51 HR 104 sats 95% 2l) General Behavior During Session: [facility] for tasks performed Cognition: [facility] for tasks performed  Rada Hay 09/20/2011, 2:00 PM

## 2011-09-20 NOTE — Progress Notes (Signed)
Subjective: 1 Day Post-Op Procedure(s) (LRB): TOTAL HIP ARTHROPLASTY ANTERIOR APPROACH (Left)Nausea, poor po intake, vagal response to discomfort. PT present using sheet to help lift left leg.  Patient reports pain as marked.    Objective:   VITALS:  Temp:  [98 F (36.7 C)-100.9 F (38.3 C)] 98.4 F (36.9 C) (03/02 0531) Pulse Rate:  [100-113] 103  (03/02 0531) Resp:  [12-19] 16  (03/02 0929) BP: (112-151)/(68-85) 112/68 mmHg (03/02 0531) SpO2:  [97 %-100 %] 98 % (03/02 0929) Weight:  [96.163 kg (212 lb)] 96.163 kg (212 lb) (03/01 1245)  Neurologically intact ABD soft Neurovascular intact Sensation intact distally Intact pulses distally Dorsiflexion/Plantar flexion intact Incision: dressing C/D/I and no drainage   LABS  Basename 09/20/11 0414  HGB 11.0*  WBC 11.4*  PLT 158    Basename 09/20/11 0414  NA 134*  K 4.0  CL 104  CO2 26  BUN 14  CREATININE 0.83  GLUCOSE 166*   No results found for this basename: LABPT:2,INR:2 in the last 72 hours   Assessment/Plan: 1 Day Post-Op Procedure(s) (LRB): TOTAL HIP ARTHROPLASTY ANTERIOR APPROACH (Left)  Advance diet Up with therapy Advance to po pain meds. Casten Floren E 09/20/2011, 10:32 AM

## 2011-09-21 LAB — CBC
Hemoglobin: 11 g/dL — ABNORMAL LOW (ref 13.0–17.0)
MCH: 30 pg (ref 26.0–34.0)
MCHC: 34.2 g/dL (ref 30.0–36.0)
MCV: 87.7 fL (ref 78.0–100.0)
Platelets: 160 10*3/uL (ref 150–400)
RBC: 3.67 MIL/uL — ABNORMAL LOW (ref 4.22–5.81)

## 2011-09-21 MED ORDER — SENNOSIDES-DOCUSATE SODIUM 8.6-50 MG PO TABS
2.0000 | ORAL_TABLET | Freq: Once | ORAL | Status: AC
  Administered 2011-09-21: 2 via ORAL
  Filled 2011-09-21: qty 2

## 2011-09-21 MED ORDER — POLYETHYLENE GLYCOL 3350 17 G PO PACK
17.0000 g | PACK | Freq: Every day | ORAL | Status: DC
  Administered 2011-09-21 – 2011-09-22 (×2): 17 g via ORAL
  Filled 2011-09-21 (×2): qty 1

## 2011-09-21 NOTE — Progress Notes (Signed)
Physical Therapy Treatment Patient Details Name: Triston Skare MRN: [id number] DOB: [birth year] Today's Date: 09/21/2011  PT Assessment/Plan  PT - Assessment/Plan Comments on Treatment Session: progressing well PT Plan: Discharge plan remains appropriate;Frequency remains appropriate PT Frequency: 7X/week Follow Up Recommendations: Home health PT Equipment Recommended: Rolling walker with 5" wheels PT Goals  Acute Rehab PT Goals Pt will go Sit to Supine/Side: with supervision;with HOB 0 degrees PT Goal: Sit to Supine/Side - Progress: Progressing toward goal Pt will go Sit to Stand: with supervision PT Goal: Sit to Stand - Progress: Progressing toward goal Pt will go Stand to Sit: with supervision PT Goal: Stand to Sit - Progress: Progressing toward goal Pt will Ambulate: >150 feet;with supervision;with rolling walker PT Goal: Ambulate - Progress: Progressing toward goal Pt will Perform Home Exercise Program: with supervision, verbal cues required/provided PT Goal: Perform Home Exercise Program - Progress: Progressing toward goal  PT Treatment Precautions/Restrictions  Precautions Precautions: Other (comment) (NONE) Precaution Booklet Issued: No Precaution Comments: Pt does not have anterior hip precautions --Direct approach Required Braces or Orthoses: No Restrictions Weight Bearing Restrictions: No LLE Weight Bearing: Weight bearing as tolerated Mobility (including Balance) Bed Mobility Bed Mobility: Yes Sit to Supine: 4: Min assist;HOB flat Sit to Supine - Details (indicate cue type and reason): min with LLE, instructed in self assist with sheet loop Transfers Sit to Stand: 5: Supervision;From chair/3-in-1;With upper extremity assist Sit to Stand Details (indicate cue type and reason): cues for hands and LLE position Stand to Sit: 5: Supervision;To bed;With upper extremity assist Stand to Sit Details: verbal cues for LLE and hand  placement Ambulation/Gait Ambulation/Gait Assistance: 5: Supervision Ambulation/Gait Assistance Details (indicate cue type and reason): cues sequence an wt shift Ambulation Distance (Feet): 80 Feet Assistive device: Rolling walker Gait Pattern: Step-to pattern;Step-through pattern;Decreased hip/knee flexion - left Gait velocity: slow    Exercise  Total Joint Exercises Short Arc Quad: AROM;AAROM;Left;10 reps Heel Slides: AAROM;Left;15 reps End of Session PT - End of Session Activity Tolerance: Patient tolerated treatment well Patient left: in bed;with bed alarm set;with family/visitor present Nurse Communication: Mobility status for ambulation General Behavior During Session: [facility] for tasks performed Cognition: [facility] for tasks performed  [facility] 09/21/2011, 3:44 PM

## 2011-09-21 NOTE — Progress Notes (Signed)
Cm offered pt choice for [facility] agencies in [place]. Per pt choice [facility] care to provide HHPT/OT. CM attempted to contact [facility] care at [phone], no one available on weekend. CM contacted [facility] hospital at [phone], no one was able to assist CM with how to fax new referral to [facility] care. Weekend CM to have weekday CM to follow up with agency on Monday on how to referr a new pt. Pt request RW, [facility] to deliver RW prior to discharge. Awaiting MD orders for Home Health & RW.  Leonie Green [phone]

## 2011-09-21 NOTE — Evaluation (Signed)
Occupational Therapy Evaluation Patient Details Name: [name] MRN: [id number] DOB: 10-26-1965 Today's Date: 09/21/2011  Problem List:  Patient Active Problem List  Diagnoses  . Degenerative arthritis of hip    Past Medical History:  Past Medical History  Diagnosis Date  . PONV (postoperative nausea and vomiting)   . Arthritis     bilateral hips  . Strep throat 09/17/11    on Zpac   Past Surgical History:  Past Surgical History  Procedure Date  . Hernia repair     left inguinal   . Wisdom tooth extraction     OT Assessment/Plan/Recommendation OT Assessment Clinical Impression Statement: 46 yo male s/p Lt direct anterior hip arthroplasty that could benefit from skilled OT acutely. Pt with decreased bed mobility and requires (A) with basic transfers. Pt has wife (A) at d/c home and has been educated on AE. OT Recommendation/Assessment: Patient will need skilled OT in the acute care venue OT Problem List: Decreased strength;Decreased activity tolerance;Impaired balance (sitting and/or standing);Decreased safety awareness;Decreased knowledge of use of DME or AE;Pain OT Therapy Diagnosis : Generalized weakness;Acute pain OT Plan OT Frequency: Min 2X/week OT Treatment/Interventions: Self-care/ADL training;DME and/or AE instruction;Therapeutic activities;Patient/family education;Balance training OT Recommendation Follow Up Recommendations: Home health OT Equipment Recommended: Standard walker;3 in 1 bedside comode (defer to next venue bench vs tub seat) Individuals Consulted Consulted and Agree with Results and Recommendations: Family member/caregiver;Patient OT Goals Acute Rehab OT Goals OT Goal Formulation: With patient/family Time For Goal Achievement: 7 days ADL Goals Pt Will Perform Lower Body Bathing: with modified independence;Sit to stand from bed;Sit to stand from chair ADL Goal: Lower Body Bathing - Progress: Goal set today Pt Will Perform Lower Body Dressing:  with modified independence;Sit to stand from chair;Sit to stand from bed ADL Goal: Lower Body Dressing - Progress: Goal set today Pt Will Transfer to Toilet: with modified independence;Ambulation;3-in-1 ADL Goal: Toilet Transfer - Progress: Goal set today Pt Will Perform Toileting - Clothing Manipulation: with modified independence;Sitting on 3-in-1 or toilet ADL Goal: Toileting - Clothing Manipulation - Progress: Goal set today Pt Will Perform Toileting - Hygiene: with modified independence;Sit to stand from 3-in-1/toilet ADL Goal: Toileting - Hygiene - Progress: Goal set today Pt Will Perform Tub/Shower Transfer: with supervision;Ambulation;Transfer tub bench ADL Goal: Tub/Shower Transfer - Progress: Goal set today Miscellaneous OT Goals Miscellaneous OT Goal #1: Pt will perform bed mobility with no bed rails HOB 20 or less Mod I as precursor to ADLS at sink level OT Goal: Miscellaneous Goal #1 - Progress: Goal set today  OT Evaluation Precautions/Restrictions  Precautions Precautions: Anterior Hip (direct) Precaution Booklet Issued: No Required Braces or Orthoses: No Restrictions Weight Bearing Restrictions: No Prior Functioning Home Living Lives With: Spouse Type of Home: Apartment Home Layout: One level Home Access: Level entry Bathroom Shower/Tub: Tub/shower unit;Curtain Firefighter: Standard Bathroom Accessibility: Yes How Accessible: Accessible via walker Home Adaptive Equipment: Crutches;Raised toilet seat with rails Additional Comments: pt states his bed is very low Prior Function Level of Independence: Independent with basic ADLs Driving: Yes ADL ADL Eating/Feeding: Performed;Set up Where Assessed - Eating/Feeding: Bed level Grooming: Simulated;Minimal assistance;Wash/dry hands Where Assessed - Grooming: Standing at sink Lower Body Bathing: Simulated;Minimal assistance (using AE PRN) Lower Body Bathing Details (indicate cue type and reason): Pt return  demonstration of how to use a sponge on LB Where Assessed - Lower Body Bathing: Unsupported;Sitting, bed Upper Body Dressing: Performed;Set up Upper Body Dressing Details (indicate cue type and reason): don gown as robe Where  Assessed - Upper Body Dressing: Sitting, bed;Unsupported Lower Body Dressing: Performed;Minimal assistance (AE used) Lower Body Dressing Details (indicate cue type and reason): used a reacher to doff sock and sock aide to don sock Where Assessed - Lower Body Dressing: Sitting, bed;Unsupported Toilet Transfer: Simulated;Minimal assistance Toilet Transfer Method: Freight forwarder Equipment: Raised toilet seat with arms (or 3-in-1 over toilet) Equipment Used: Reacher;Rolling walker;Sock aid;Long-handled shoe horn;Long-handled sponge Ambulation Related to ADLs: Pt ambulating ~ 75 feet with Mod (A) progressing to Min (A). Pt with decreased Lt LE advancing. Pt educated on RW safety and sequence for ambulation. Pt with improved weight shift and required (A) to advance Lt LE forward.  ADL Comments: Pt required extensive (A) for bed mobility this session. Pt educated on using a rolled sheet as a leg lifter to increase independence with bed mobility. Pt positioned in chair at end of session with wife available to (A) Vision/Perception  Vision - History Baseline Vision: Wears glasses all the time Patient Visual Report: No change from baseline Cognition Cognition Arousal/Alertness: Awake/alert Overall Cognitive Status: Appears within functional limits for tasks assessed Orientation Level: Oriented X4 Sensation/Coordination Sensation Light Touch: Appears Intact Coordination Gross Motor Movements are Fluid and Coordinated: Yes Extremity Assessment RUE Assessment RUE Assessment: Within Functional Limits LUE Assessment LUE Assessment: Within Functional Limits Mobility  Bed Mobility Bed Mobility: Yes Left Sidelying to Sit: 2: Max assist;With rails;HOB elevated  (comment degrees) (HOB 20 degrees. Lt bed rail used) Supine to Sit: 2: Max assist;HOB elevated (Comment degrees) (HOB 20 degrees) Supine to Sit Details (indicate cue type and reason): v/c for using Rt LE to bridge buttock toward EOB. Pt using rolled sheet as leg lifter. Pt required (A) with pad to scoot toward EOB.  Transfers Transfers: Yes Sit to Stand: 4: Min assist;From bed;With upper extremity assist;From elevated surface Sit to Stand Details (indicate cue type and reason): v/c for hand placement with RW safety Stand to Sit: 4: Min assist;With upper extremity assist;With armrests;To chair/3-in-1 Stand to Sit Details: educated on extending Lt LE and controlled descend to chair Exercises   End of Session OT - End of Session Equipment Utilized During Treatment: Gait belt Activity Tolerance: Patient tolerated treatment well Patient left: in chair;with call bell in reach;with family/visitor present (wife) Nurse Communication: Mobility status for transfers General Behavior During Session: [facility] for tasks performed Cognition: [facility] for tasks performed   Lucile Shutters 09/21/2011, 9:54 AM  Pager: [phone]

## 2011-09-21 NOTE — Progress Notes (Signed)
Subjective: 2 Days Post-Op Procedure(s) (LRB): TOTAL HIP ARTHROPLASTY ANTERIOR APPROACH (Left) awake alert oriented x4. Pain much improved changing to hydrocodone. Voiding without difficulty. Tolerating by mouth narcotics and food.  Patient reports pain as moderate.    Objective:   VITALS:  Temp:  [98.2 F (36.8 C)-100 F (37.8 C)] 98.4 F (36.9 C) (03/03 1423) Pulse Rate:  [101-116] 116  (03/03 1423) Resp:  [16-19] 16  (03/03 1423) BP: (116-145)/(73-78) 121/73 mmHg (03/03 1423) SpO2:  [93 %-98 %] 98 % (03/03 1423)  Neurologically intact ABD soft Neurovascular intact Sensation intact distally Intact pulses distally Dorsiflexion/Plantar flexion intact Incision: scant drainage No cellulitis present   LABS  Basename 09/21/11 0414 09/20/11 0414  HGB 11.0* 11.0*  WBC 9.8 11.4*  PLT 160 158    Basename 09/20/11 0414  NA 134*  K 4.0  CL 104  CO2 26  BUN 14  CREATININE 0.83  GLUCOSE 166*   No results found for this basename: LABPT:2,INR:2 in the last 72 hours   Assessment/Plan: 2 Days Post-Op Procedure(s) (LRB): TOTAL HIP ARTHROPLASTY ANTERIOR APPROACH (Left)  Advance diet Up with therapy D/C IV fluids Discharge home with home health Miralax/sennakot  Nox Talent E 09/21/2011, 3:02 PM

## 2011-09-21 NOTE — Progress Notes (Addendum)
Physical Therapy Treatment Patient Details Name: [name] MRN: [id number] DOB: 06/10/1966 Today's Date: 09/21/2011  PT Assessment/Plan  PT - Assessment/Plan Comments on Treatment Session: pt doing well; Amb WITH OT earlier.  Still willing to amb and do ther ex with PT this am after resting; Pt/wife would like to work on bed mobility, discussed and will practice this pm PT Plan: Discharge plan remains appropriate;Frequency remains appropriate PT Frequency: 7X/week Follow Up Recommendations: Home health PT Equipment Recommended: Rolling walker with 5" wheels PT Goals  Acute Rehab PT Goals Pt will go Sit to Stand: with supervision PT Goal: Sit to Stand - Progress: Progressing toward goal Pt will go Stand to Sit: with supervision PT Goal: Stand to Sit - Progress: Progressing toward goal Pt will Ambulate: >150 feet;with supervision;with rolling walker PT Goal: Ambulate - Progress: Progressing toward goal Pt will Perform Home Exercise Program: with supervision, verbal cues required/provided PT Goal: Perform Home Exercise Program - Progress: Progressing toward goal  PT Treatment Precautions/Restrictions  Precautions Precautions: Other (comment) (NONE) Precaution Booklet Issued: No Precaution Comments: Pt does not have anterior hip precautions --Direct approach Required Braces or Orthoses: No Restrictions Weight Bearing Restrictions: No LLE Weight Bearing: Weight bearing as tolerated Mobility (including Balance) Sit to Stand: 4: Min assist;5: Supervision;From chair/3-in-1;With upper extremity assist Sit to Stand Details (indicate cue type and reason): cues for hands and LLE position Stand to Sit: 4: Min assist;With upper extremity assist;With armrests;To chair/3-in-1 Stand to Sit Details: verbal cues for LLE and hand placement Ambulation/Gait Ambulation/Gait Assistance: 5: Supervision;4: Min assist Ambulation/Gait Assistance Details (indicate cue type and reason): min/guard for  balance, verbal cues for posture, upward gaze and RW position; initially needed assist to advance LLE Ambulation Distance (Feet): 70 Feet Assistive device: Rolling walker Gait Pattern: Step-to pattern Gait velocity: slow    Exercise  Total Joint Exercises Ankle Circles/Pumps: Left;10 reps;Supine;AROM Heel Slides: AAROM;Left;15 reps End of Session PT - End of Session Activity Tolerance: Patient tolerated treatment well Patient left: in chair;with call bell in reach;with family/visitor present Nurse Communication: Mobility status for ambulation General Behavior During Session: [facility] for tasks performed Cognition: [facility] for tasks performed  [facility] 09/21/2011, 11:47 AM

## 2011-09-22 LAB — CBC
HCT: 30.8 % — ABNORMAL LOW (ref 39.0–52.0)
MCHC: 34.1 g/dL (ref 30.0–36.0)
MCV: 88.3 fL (ref 78.0–100.0)
Platelets: 156 10*3/uL (ref 150–400)
RDW: 13.6 % (ref 11.5–15.5)
WBC: 8.8 10*3/uL (ref 4.0–10.5)

## 2011-09-22 MED ORDER — RIVAROXABAN 10 MG PO TABS: 10.0000 mg | ORAL_TABLET | Freq: Every day | ORAL | Status: DC

## 2011-09-22 MED ORDER — OXYCODONE-ACETAMINOPHEN 5-325 MG PO TABS: 1.0000 | ORAL_TABLET | ORAL | Status: AC | PRN

## 2011-09-22 MED ORDER — METHOCARBAMOL 500 MG PO TABS: 500.0000 mg | ORAL_TABLET | Freq: Four times a day (QID) | ORAL | Status: AC | PRN

## 2011-09-22 NOTE — Discharge Instructions (Signed)
You can get your actual incision wet starting this Wed 3/6. Dry dressing daily as needed. Follow-up in 2 weeks at [facility]

## 2011-09-22 NOTE — Progress Notes (Signed)
Physical Therapy Treatment Patient Details Name: [name] MRN: [id number] DOB: 1965-11-23 Today's Date: 09/22/2011  [id number] PT Assessment/Plan  PT - Assessment/Plan Comments on Treatment Session: progressing well, ready for DC to home. pt to consider leg lifter. PT Plan: Discharge plan remains appropriate;Frequency remains appropriate PT Frequency: 7X/week Follow Up Recommendations: Home health PT Equipment Recommended: Rolling walker with 5" wheels PT Goals  Acute Rehab PT Goals Pt will go Sit to Stand: with supervision PT Goal: Sit to Stand - Progress: Met Pt will go Stand to Sit: with supervision PT Goal: Stand to Sit - Progress: Met Pt will Ambulate: >150 feet;with supervision;with rolling walker PT Goal: Ambulate - Progress: Met Pt will Perform Home Exercise Program: with supervision, verbal cues required/provided PT Goal: Perform Home Exercise Program - Progress: Met  PT Treatment Precautions/Restrictions  Precautions Precautions: Other (comment) (NONE) Precaution Booklet Issued: No Precaution Comments: Pt does not have anterior hip precautions --Direct approach Required Braces or Orthoses: No Restrictions Weight Bearing Restrictions: No LLE Weight Bearing: Weight bearing as tolerated Mobility (including Balance) Bed Mobility Supine to Sit: 5: Supervision;HOB elevated (Comment degrees) Supine to Sit Details (indicate cue type and reason): much improved this session w/HOB to 20 degrees. Transfers Sit to Stand: 5: Supervision;With armrests;From chair/3-in-1 Sit to Stand Details (indicate cue type and reason): did not require cues for hand placement. Good technique Stand to Sit: 5: Supervision;With armrests;To chair/3-in-1 Ambulation/Gait Ambulation/Gait Assistance: 5: Supervision Ambulation/Gait Assistance Details (indicate cue type and reason): step length, attempt to roll vs stop to improve fluid movement. Ambulation Distance (Feet): 80 Feet Assistive device:  Rolling walker Gait Pattern: Step-to pattern;Step-through pattern;Decreased hip/knee flexion - left Gait velocity: slow    Exercise  Total Joint Exercises Ankle Circles/Pumps: Left;10 reps;Supine;AROM Quad Sets: AROM;Left;10 reps;Seated Hip ABduction/ADduction:  (demo while standing) Long Arc Quad: AROM;Left;10 reps;Seated Knee Flexion: AROM;Left;10 reps;Standing End of Session PT - End of Session Activity Tolerance: Patient tolerated treatment well Patient left: in chair;with family/visitor present Nurse Communication: Mobility status for ambulation General Behavior During Session: [facility] for tasks performed Cognition: [facility] for tasks performed  Rada Hay 09/22/2011, 10:57 AM

## 2011-09-22 NOTE — Discharge Summary (Signed)
Patient ID: [name] MRN: [id number] DOB/AGE: Jan 08, 1966 46 y.o.  Admit date: 09/19/2011 Discharge date: 09/22/2011  Admission Diagnoses:  Principal Problem:  *Degenerative arthritis of hip   Discharge Diagnoses:  Same  Past Medical History  Diagnosis Date  . PONV (postoperative nausea and vomiting)   . Arthritis     bilateral hips  . Strep throat 09/17/11    on Zpac    Surgeries: Procedure(s): TOTAL HIP ARTHROPLASTY ANTERIOR APPROACH on 09/19/2011   Consultants:    Discharged Condition: Improved  Hospital Course: Kyson Kupper is an 46 y.o. male who was admitted 09/19/2011 for operative treatment ofDegenerative arthritis of hip. Patient has severe unremitting pain that affects sleep, daily activities, and work/hobbies. After pre-op clearance the patient was taken to the operating room on 09/19/2011 and underwent  Procedure(s): TOTAL HIP ARTHROPLASTY ANTERIOR APPROACH.    Patient was given perioperative antibiotics: Anti-infectives     Start     Dose/Rate Route Frequency Ordered Stop   09/19/11 1600   azithromycin (ZITHROMAX) tablet 250 mg        250 mg Oral Daily 09/19/11 1450     09/19/11 1600   ceFAZolin (ANCEF) IVPB 1 g/50 mL premix        1 g 100 mL/hr over 30 Minutes Intravenous Every 6 hours 09/19/11 1450 09/20/11 0435   09/19/11 0545   ceFAZolin (ANCEF) IVPB 2 g/50 mL premix  Status:  Discontinued        2 g 100 mL/hr over 30 Minutes Intravenous 60 min pre-op 09/19/11 0544 09/19/11 1229           Patient was given sequential compression devices, early ambulation, and chemoprophylaxis to prevent DVT.  Patient benefited maximally from hospital stay and there were no complications.    Recent vital signs: Patient Vitals for the past 24 hrs:  BP Temp Temp src Pulse Resp SpO2  10/02/11 2150 137/79 mmHg 97.9 F (36.6 C) Oral 101  17  95 %  October 02, 2011 1423 121/73 mmHg 98.4 F (36.9 C) Oral 116  16  98 %  10-02-2011 0803 - - - - 16  -     Recent laboratory  studies:  Basename 09/22/11 0412 10-02-2011 0414 09/20/11 0414  WBC 8.8 9.8 --  HGB 10.5* 11.0* --  HCT 30.8* 32.2* --  PLT 156 160 --  NA -- -- 134*  K -- -- 4.0  CL -- -- 104  CO2 -- -- 26  BUN -- -- 14  CREATININE -- -- 0.83  GLUCOSE -- -- 166*  INR -- -- --  CALCIUM -- -- 8.4     Discharge Medications:   Medication List  As of 09/22/2011  6:43 AM   TAKE these medications         acetaminophen 500 MG tablet   Commonly known as: TYLENOL   Take 1,000 mg by mouth every 6 (six) hours as needed. Pain        atorvastatin 40 MG tablet   Commonly known as: LIPITOR   Take 40 mg by mouth daily before breakfast.      azithromycin 250 MG tablet   Commonly known as: ZITHROMAX   Take 250 mg by mouth daily.      cetirizine 10 MG tablet   Commonly known as: ZYRTEC   Take 10 mg by mouth daily.      cholecalciferol 1000 UNITS tablet   Commonly known as: VITAMIN D   Take 1,000 Units by mouth daily.  methocarbamol 500 MG tablet   Commonly known as: ROBAXIN   Take 1 tablet (500 mg total) by mouth 4 (four) times daily as needed.      mulitivitamin with minerals Tabs   Take 1 tablet by mouth daily.      oxyCODONE-acetaminophen 5-325 MG per tablet   Commonly known as: PERCOCET   Take 1-2 tablets by mouth every 4 (four) hours as needed for pain.      rivaroxaban 10 MG Tabs tablet   Commonly known as: XARELTO   Take 1 tablet (10 mg total) by mouth daily with breakfast.            Diagnostic Studies: Dg Hip Complete Left  09/19/2011  *RADIOLOGY REPORT*  Clinical Data: Left total hip arthroplasty  LEFT HIP - COMPLETE 2+ VIEW  Comparison: None.  Findings: Left total hip arthroplasty is in place.  There is anatomic alignment of the osseous and prosthetic structures.  No breakage or loosening of the hardware.  IMPRESSION: Left total hip arthroplasty anatomically aligned.  Original Report Authenticated By: Donavan Burnet, M.D.   Dg Pelvis Portable  09/19/2011  *RADIOLOGY REPORT*   Clinical Data: Postop  PORTABLE PELVIS  Comparison: None.  Findings: Left total hip arthroplasty is in place.  Anatomic alignment of the osseous and prosthetic structures.  No breakage or loosening of the hardware.  IMPRESSION: Left total hip arthroplasty anatomically aligned.  Original Report Authenticated By: Donavan Burnet, M.D.   Dg Hip Portable 1 View Left  09/19/2011  *RADIOLOGY REPORT*  Clinical Data: Postop from left hip arthroplasty.  PORTABLE LEFT HIP - 1 VIEW  Comparison: 09/19/2011  Findings: Cross-table lateral view shows expected location and appearance of the bipolar left hip prosthesis.  No fracture or dislocation identified.  IMPRESSION: Expected postoperative appearance of bipolar left hip prosthesis.  Original Report Authenticated By: Danae Orleans, M.D.   Dg C-arm 61-120 Min-no Report  09/19/2011  CLINICAL DATA: anterior hip   C-ARM 61-120 MINUTES  Fluoroscopy was utilized by the requesting physician.  No radiographic  interpretation.      Disposition: to home      Signed: Kathryne Hitch 09/22/2011, 6:43 AM

## 2011-09-22 NOTE — Progress Notes (Signed)
Occupational Therapy Treatment Patient Details Name: [name] MRN: [id number] DOB: May 25, 1966 Today's Date: 09/22/2011  OT Assessment/Plan OT Assessment/Plan Comments on Treatment Session: Pt doing well this session. D/C planned for today. OT Plan: Discharge plan remains appropriate OT Frequency: Min 2X/week Follow Up Recommendations: Home health OT Equipment Recommended: Rolling walker with 5" wheels OT Goals ADL Goals ADL Goal: Lower Body Bathing - Progress: Progressing toward goals ADL Goal: Lower Body Dressing - Progress: Progressing toward goals ADL Goal: Toilet Transfer - Progress: Progressing toward goals Miscellaneous OT Goals OT Goal: Miscellaneous Goal #1 - Progress: Progressing toward goals  OT Treatment Precautions/Restrictions  Restrictions Weight Bearing Restrictions: No LLE Weight Bearing: Weight bearing as tolerated   ADL ADL Grooming: Performed;Wash/dry face;Wash/dry hands;Teeth care;Brushing hair;Set up Where Assessed - Grooming: Sitting, bed;Unsupported Upper Body Bathing: Performed;Set up Where Assessed - Upper Body Bathing: Sitting, bed;Unsupported Lower Body Bathing: Performed;Minimal assistance Lower Body Bathing Details (indicate cue type and reason): wife bathed pt's feet. Where Assessed - Lower Body Bathing: Sit to stand from bed Upper Body Dressing: Performed;Set up Where Assessed - Upper Body Dressing: Sitting, chair;Unsupported Lower Body Dressing: Performed;Moderate assistance Lower Body Dressing Details (indicate cue type and reason): declined the use of AE. A needed to thread LEs into pant legs. Once standing pt able to pull pants up without difficulty. Where Assessed - Lower Body Dressing: Sit to stand from bed Toilet Transfer: Simulated;Minimal assistance Toilet Transfer Method: Stand pivot Toilet Transfer Equipment: Other (comment) (sim to chair.) Mobility  Bed Mobility Supine to Sit: 5: Supervision;HOB elevated (Comment  degrees) Supine to Sit Details (indicate cue type and reason): much improved this session w/HOB to 20 degrees. Transfers Transfers: Yes Sit to Stand: 5: Supervision;With upper extremity assist;From bed Sit to Stand Details (indicate cue type and reason): did not require cues for hand placement. Good technique Stand to Sit: 5: Supervision;To chair/3-in-1;With upper extremity assist;With armrests Exercises    End of Session OT - End of Session Activity Tolerance: Patient tolerated treatment well Patient left: in chair;with call bell in reach General Behavior During Session: [facility] for tasks performed Cognition: [facility] for tasks performed  Garritt Molyneux A OTR/L [phone] 09/22/2011, 9:30 AM

## 2011-09-22 NOTE — Progress Notes (Signed)
D/c instruction given and explained to pt.  Prescriptions for percocet, robaxin, and xarelto given to pt. Placed in pt's hands.  Iv site d/c this am, pressure dressing applied to site.  [facility], pt has equipment also.  Pt stable and ready for d/c.  Pt d/c home with significant other at this time.

## 2011-09-22 NOTE — Progress Notes (Signed)
CARE MANAGEMENT NOTE 09/22/2011  Patient:  [name], [name]   Account Number:  [id number]  Date Initiated:  09/21/2011  Documentation initiated by:  DAVIS,TYMEEKA  Subjective/Objective Assessment:   46 yo male admitted s/p left total hip replacement. PTA pt lived home with spouse.     Action/Plan:   Home when stable   Anticipated DC Date:  09/22/2011   Anticipated DC Plan:  HOME W HOME HEALTH SERVICES  In-house referral  NA      DC Planning Services  CM consult      PAC Choice  DURABLE MEDICAL EQUIPMENT  HOME HEALTH   Choice offered to / List presented to:  C-1 Patient   DME arranged  Levan Hurst      DME agency  [facility]     HH arranged  HH-2 PT      [facility] agency  [facility]   Status of service:  Completed, signed off Medicare Important Message given?  NO (If response is "NO", the following Medicare IM given date fields will be blank) Date Medicare IM given:   Date Additional Medicare IM given:    Discharge Disposition:  HOME W HOME HEALTH SERVICES  Per UR Regulation:    Comments:  09/22/2011 Raynelle Bring BSN CCM [phone] Per phone research- [facility] went out of business several years ago. patient advised and wishes to be set up with any agency that is in network. [facility] can provide services with startt date of tomorrow 09/23/2011. RW delivered to room. List of HH agencies placed in shadow chart

## 2011-09-24 ENCOUNTER — Encounter (HOSPITAL_COMMUNITY): Payer: Self-pay | Admitting: Orthopaedic Surgery

## 2011-09-29 MED ORDER — CEFAZOLIN SODIUM 1-5 GM-% IV SOLN
INTRAVENOUS | Status: DC | PRN
  Administered 2011-09-19: 2 g via INTRAVENOUS

## 2011-09-29 NOTE — Addendum Note (Signed)
Addendum  created 09/29/11 0829 by Hulan Fess, CRNA   Modules edited:Anesthesia Medication Administration

## 2011-09-29 NOTE — Addendum Note (Signed)
Addendum  created 09/29/11 1610 by Hulan Fess, CRNA   Modules edited:Anesthesia Medication Administration

## 2014-06-07 ENCOUNTER — Ambulatory Visit: Payer: 59 | Attending: Orthopaedic Surgery | Admitting: Physical Therapy

## 2014-06-07 DIAGNOSIS — M542 Cervicalgia: Secondary | ICD-10-CM | POA: Insufficient documentation

## 2014-06-07 DIAGNOSIS — M25511 Pain in right shoulder: Secondary | ICD-10-CM | POA: Diagnosis not present

## 2014-06-07 DIAGNOSIS — M5010 Cervical disc disorder with radiculopathy, unspecified cervical region: Secondary | ICD-10-CM | POA: Insufficient documentation

## 2014-06-07 DIAGNOSIS — R293 Abnormal posture: Secondary | ICD-10-CM | POA: Insufficient documentation

## 2014-06-13 ENCOUNTER — Ambulatory Visit: Payer: 59 | Admitting: Physical Therapy

## 2014-06-13 DIAGNOSIS — M542 Cervicalgia: Secondary | ICD-10-CM | POA: Diagnosis not present

## 2014-06-19 ENCOUNTER — Ambulatory Visit: Payer: 59 | Admitting: Physical Therapy

## 2014-06-19 DIAGNOSIS — M542 Cervicalgia: Secondary | ICD-10-CM | POA: Diagnosis not present

## 2014-06-21 ENCOUNTER — Ambulatory Visit: Payer: 59 | Attending: Orthopaedic Surgery | Admitting: Rehabilitation

## 2014-06-21 DIAGNOSIS — M5010 Cervical disc disorder with radiculopathy, unspecified cervical region: Secondary | ICD-10-CM | POA: Diagnosis not present

## 2014-06-21 DIAGNOSIS — M542 Cervicalgia: Secondary | ICD-10-CM | POA: Diagnosis present

## 2014-06-21 DIAGNOSIS — R293 Abnormal posture: Secondary | ICD-10-CM | POA: Insufficient documentation

## 2014-06-21 DIAGNOSIS — M25511 Pain in right shoulder: Secondary | ICD-10-CM | POA: Insufficient documentation

## 2014-06-27 ENCOUNTER — Ambulatory Visit: Payer: 59 | Admitting: Physical Therapy

## 2014-06-27 DIAGNOSIS — M542 Cervicalgia: Secondary | ICD-10-CM | POA: Diagnosis not present

## 2014-06-29 ENCOUNTER — Ambulatory Visit: Payer: 59 | Admitting: Rehabilitation

## 2014-06-29 DIAGNOSIS — M542 Cervicalgia: Secondary | ICD-10-CM | POA: Diagnosis not present

## 2014-07-03 ENCOUNTER — Encounter: Payer: 59 | Admitting: Physical Therapy

## 2014-07-05 ENCOUNTER — Encounter: Payer: 59 | Admitting: Rehabilitation

## 2014-07-10 ENCOUNTER — Ambulatory Visit: Payer: 59 | Admitting: Rehabilitation

## 2014-07-12 ENCOUNTER — Ambulatory Visit: Payer: 59 | Admitting: Rehabilitation

## 2014-07-17 ENCOUNTER — Ambulatory Visit: Payer: 59 | Admitting: Rehabilitation

## 2014-07-17 DIAGNOSIS — M542 Cervicalgia: Secondary | ICD-10-CM | POA: Diagnosis not present

## 2014-07-19 ENCOUNTER — Ambulatory Visit: Payer: 59 | Admitting: Rehabilitation

## 2015-10-31 ENCOUNTER — Ambulatory Visit (INDEPENDENT_AMBULATORY_CARE_PROVIDER_SITE_OTHER): Payer: Commercial Managed Care - HMO | Admitting: Pulmonary Disease

## 2015-10-31 DIAGNOSIS — R0683 Snoring: Secondary | ICD-10-CM

## 2015-10-31 DIAGNOSIS — G4710 Hypersomnia, unspecified: Secondary | ICD-10-CM | POA: Diagnosis not present

## 2015-10-31 NOTE — Progress Notes (Signed)
Subjective:    Patient ID: [name], male    DOB: [birth year], 50 y.o.   MRN: [id number]  HPI   This is the case of Derek HugueninGabriel Young, 50 y.o. Male, who wanted to be seen in the office secondary to possible OSA.     As you very well know, patient denies smoking. No history of asthma or copd.  Pt has hypersomnia, snoring, witnessed, gasping for air, choking.  Sleeps 6-7 hrs. Has frequent awakenings.  (-) abnormal behavior in sleep.  Some hypersomnia at work.  Hypersomnia affects fxnality.           Review of Systems  Constitutional: Negative.  Negative for fever and unexpected weight change.  HENT: Positive for congestion, postnasal drip and sinus pressure. Negative for dental problem, ear pain, nosebleeds, rhinorrhea, sneezing, sore throat and trouble swallowing.   Eyes: Positive for redness and itching.  Respiratory: Negative.  Negative for cough, chest tightness, shortness of breath and wheezing.   Cardiovascular: Negative.  Negative for palpitations and leg swelling.  Gastrointestinal: Negative.  Negative for nausea and vomiting.  Endocrine: Negative.   Genitourinary: Negative.  Negative for dysuria.  Musculoskeletal: Positive for arthralgias. Negative for joint swelling.  Skin: Negative.  Negative for rash.  Allergic/Immunologic: Positive for environmental allergies.  Neurological: Negative.  Negative for headaches.  Hematological: Negative.  Does not bruise/bleed easily.  Psychiatric/Behavioral: Negative.  Negative for dysphoric mood. The patient is not nervous/anxious.    Past Medical History  Diagnosis Date  . PONV (postoperative nausea and vomiting)   . Arthritis     bilateral hips  . Strep throat 09/17/11    on Zpac   (-) CA, DVT  No family history on file.  Father had CAD. Mother had DM.  No h/o OSA.   Past Surgical History  Procedure Laterality Date  . Hernia repair      left inguinal   . Wisdom tooth extraction    . Total hip  arthroplasty  09/19/2011    Procedure: TOTAL HIP ARTHROPLASTY ANTERIOR APPROACH;  Surgeon: Kathryne Hitchhristopher Y Blackman, MD;  Location: [facility];  Service: Orthopedics;  Laterality: Left;  S/P C5 C6 and C6 C7 surgery in 2016.   Social History   Social History  . Marital Status: Married    Spouse Name: N/A  . Number of Children: N/A  . Years of Education: N/A   Occupational History  . Not on file.   Social History Main Topics  . Smoking status: Former Smoker -- 20 years    Types: Cigarettes    Quit date: 09/03/2011  . Smokeless tobacco: Never Used  . Alcohol Use: 1.8 oz/week    3 Glasses of wine per week  . Drug Use: No  . Sexual Activity: Not on file   Other Topics Concern  . Not on file   Social History Narrative   Work as a Paramedicplanning manager. (-) smoke. Occasional ETOH.   No Known Allergies   Outpatient Prescriptions Prior to Visit  Medication Sig Dispense Refill  . atorvastatin (LIPITOR) 40 MG tablet Take 40 mg by mouth daily before breakfast.    . cetirizine (ZYRTEC) 10 MG tablet Take 10 mg by mouth daily.    Marland Kitchen. acetaminophen (TYLENOL) 500 MG tablet Take 1,000 mg by mouth every 6 (six) hours as needed. Pain     . rivaroxaban (XARELTO) 10 MG TABS tablet Take 1 tablet (10 mg total) by mouth daily with breakfast. 20 tablet 0  . azithromycin (ZITHROMAX) 250  MG tablet Take 250 mg by mouth daily.    . cholecalciferol (VITAMIN D) 1000 UNITS tablet Take 1,000 Units by mouth daily. Reported on 10/31/2015    . Multiple Vitamin (MULITIVITAMIN WITH MINERALS) TABS Take 1 tablet by mouth daily.     No facility-administered medications prior to visit.   Meds ordered this encounter  Medications  . naproxen sodium (ANAPROX) 220 MG tablet    Sig: Take 220 mg by mouth 2 (two) times daily with a meal.  . aspirin 81 MG tablet    Sig: Take 81 mg by mouth daily.  . Misc Natural Products (OSTEO BI-FLEX JOINT SHIELD PO)    Sig: Take by mouth.   Not on blood thinners.       Objective:    Physical Exam  Vitals:  Filed Vitals:   10/31/15 1508  BP: 118/78  Pulse: 70  Height:  (1.803 m)  Weight: 206 lb (93.441 kg)  SpO2: 96%    Constitutional/General:  Pleasant, well-nourished, well-developed, not in any distress,  Comfortably seating.  Well kempt  Body mass index is 28.74 kg/(m^2). Wt Readings from Last 3 Encounters:  10/31/15 206 lb (93.441 kg)  09/19/11 212 lb (96.163 kg)  09/09/11 212 lb 8 oz (96.389 kg)    Neck circumference: 17.5 inches  HEENT: Pupils equal and reactive to light and accommodation. Anicteric sclerae. Normal nasal mucosa.   No oral  lesions,  mouth clear,  oropharynx clear, no postnasal drip. (-) Oral thrush. No dental caries.  Airway - Mallampati class III retrognathia  Neck: No masses. Midline trachea. No JVD, (-) LAD. (-) bruits appreciated.  Respiratory/Chest: Grossly normal chest. (-) deformity. (-) Accessory muscle use.  Symmetric expansion. (-) Tenderness on palpation.  Resonant on percussion.  Diminished BS on both lower lung zones. (-) wheezing, crackles, rhonchi (-) egophony  Cardiovascular: Regular rate and  rhythm, heart sounds normal, no murmur or gallops, no peripheral edema  Gastrointestinal:  Normal bowel sounds. Soft, non-tender. No hepatosplenomegaly.  (-) masses.   Musculoskeletal:  Normal muscle tone. Normal gait.   Extremities: Grossly normal. (-) clubbing, cyanosis.  (-) edema  Skin: (-) rash,lesions seen.   Neurological/Psychiatric : alert, oriented to time, place, person. Normal mood and affect            Assessment & Plan:  Hypersomnia Pt has hypersomnia, snoring, witnessed, gasping for air, choking.  Sleeps 6-7 hrs. Has frequent awakenings.  (-) abnormal behavior in sleep.  Some hypersomnia at work.  Hypersomnia affects fxnality.  Snoring and sx affect and bother wife.  ESS  12 Neck circ 17.5 inches. (+) Retrognathia Plan : 1. Plan for HST 2. Pt and wife very interested with  oral device. Likely will send to Dr. Althea Grimmer  or [facility] dentistry.  3. Need to call pt with results. No CPAP unless severe. Pt hesitant to use cpap.  Will want oral device. Try merkatz. Has a local dentist.   Snoring May be related to OSA. If hst (-), will need oral device as well.     Return to clinic in 3 mos.  Pollie Meyer, MD 11/01/2015, 9:40 AM [facility] Pager [phone] After 3 pm or if no answer, call [phone]

## 2015-10-31 NOTE — Patient Instructions (Signed)
1. We will schedule you for a home sleep study.  Return to clinic in 3 mos.

## 2015-11-01 ENCOUNTER — Encounter: Payer: Self-pay | Admitting: Pulmonary Disease

## 2015-11-01 DIAGNOSIS — R0683 Snoring: Secondary | ICD-10-CM | POA: Insufficient documentation

## 2015-11-01 NOTE — Assessment & Plan Note (Addendum)
Pt has hypersomnia, snoring, witnessed, gasping for air, choking.  Sleeps 6-7 hrs. Has frequent awakenings.  (-) abnormal behavior in sleep.  Some hypersomnia at work.  Hypersomnia affects fxnality.  Snoring and sx affect and bother wife.  ESS  12 Neck circ 17.5 inches. (+) Retrognathia Plan : 1. Plan for HST 2. Pt and wife very interested with oral device. Likely will send to [facility] Althea [facility]immerMark Katz  or [facility].  3. Need to call pt with results. No CPAP unless severe. Pt hesitant to use cpap.  Will want oral device. Try merkatz. Has a Radiation protection practitionerlocal dentist.

## 2015-11-01 NOTE — Assessment & Plan Note (Signed)
May be related to OSA. If hst (-), will need oral device as well.

## 2015-11-14 DIAGNOSIS — G4733 Obstructive sleep apnea (adult) (pediatric): Secondary | ICD-10-CM | POA: Diagnosis not present

## 2015-11-19 DIAGNOSIS — G4733 Obstructive sleep apnea (adult) (pediatric): Secondary | ICD-10-CM | POA: Diagnosis not present

## 2015-11-20 ENCOUNTER — Other Ambulatory Visit: Payer: Self-pay | Admitting: *Deleted

## 2015-11-20 ENCOUNTER — Telehealth: Payer: Self-pay | Admitting: Pulmonary Disease

## 2015-11-20 DIAGNOSIS — G4733 Obstructive sleep apnea (adult) (pediatric): Secondary | ICD-10-CM

## 2015-11-20 DIAGNOSIS — G4710 Hypersomnia, unspecified: Secondary | ICD-10-CM

## 2015-11-20 NOTE — Telephone Encounter (Signed)
Orders placed.

## 2015-11-20 NOTE — Telephone Encounter (Signed)
   I called up pt and updated him of home sleep test.   Pt stops breathing 17 times an hour.   He wants to try autocpap. If it does not work, will look into oral device.     Please order autoCPAP 5-15 cm H2O. Patient will need a mask fitting session. Patient will need a 1 month download.   He is out of town and wont be back until late next week. Pls let DME call late next week.    Patient needs to be seen 4-6 weeks after obtaining the cpap machine. Let me know if you receive this.   Thanks!   J. Alexis FrockAngelo A de Dios, MD 11/20/2015, 9:46 AM

## 2016-01-30 ENCOUNTER — Ambulatory Visit: Payer: Commercial Managed Care - HMO | Admitting: Pulmonary Disease

## 2016-07-10 ENCOUNTER — Ambulatory Visit (INDEPENDENT_AMBULATORY_CARE_PROVIDER_SITE_OTHER): Payer: 59 | Admitting: Orthopaedic Surgery

## 2016-07-10 ENCOUNTER — Ambulatory Visit (INDEPENDENT_AMBULATORY_CARE_PROVIDER_SITE_OTHER): Payer: 59

## 2016-07-10 DIAGNOSIS — M25551 Pain in right hip: Secondary | ICD-10-CM

## 2016-07-10 DIAGNOSIS — M25561 Pain in right knee: Secondary | ICD-10-CM

## 2016-07-10 DIAGNOSIS — M1611 Unilateral primary osteoarthritis, right hip: Secondary | ICD-10-CM | POA: Diagnosis not present

## 2016-07-10 DIAGNOSIS — G8929 Other chronic pain: Secondary | ICD-10-CM | POA: Diagnosis not present

## 2016-07-10 DIAGNOSIS — M25531 Pain in right wrist: Secondary | ICD-10-CM

## 2016-07-10 NOTE — Progress Notes (Signed)
Office Visit Note   Patient: [name]           Date of Birth: 05-19-1966           MRN: [id number] Visit Date: 07/10/2016              Requested by: Verl BangsAlexei Radiontchenko, MD [address] [place], [place] [id number] PCP: Verl BangsADIONTCHENKO, ALEXEI, MD   Assessment & Plan: Visit Diagnoses:  1. Pain in right hip   2. Chronic pain of right knee   3. Pain in right wrist   4. Unilateral primary osteoarthritis, right hip     Plan: As far as his wrist goes, I feel that this is a TFCC injury. He will likely benefit from a steroid injection at some point. For now will try wrist splint and some topical anti-inflammatories. He understands that he does have osteophytes and degenerative joint disease of his right hip andhis next several be a hip replacement. He's had a left hip replacement for tolerated this well. He has end-stage arthritis in this right hip I do feel that this contributes to his right knee pain. I did show him quadrant strengthening exercises to try for his right knee. He can try topical anti-inflammatories as right knee as well. For now he'll follow-up as needed but he understands our recommendations fully.  Follow-Up Instructions: Return if symptoms worsen or fail to improve.   Orders:  Orders Placed This Encounter  Procedures  . XR Knee 1-2 Views Right  . XR HIP UNILAT W OR W/O PELVIS 1V RIGHT   No orders of the defined types were placed in this encounter.     Procedures: No procedures performed   Clinical Data: No additional findings.   Subjective: No chief complaint on file. He is someone well-known to me. We performed a left total hip arthroplasty on him 5 years ago. He comes in with right hip pain, right knee pain, and right wrist pain. His right wrist hurts on the ulnar aspect of the wrist where he points to. His right knee hurts around the lateral medial side of the knee but not with joint line more proximal to this and it hurts when he goes up and down  steps. He denies any locking catching with his knee or any instability symptoms. His right hip hurts in the groin. All these things of that really affect his quality of life his activities daily living and his mobility.  HPI  Review of Systems He denies any headache, shortness of breath, chest pain, fever, chills, nausea, vomiting. He denies a nubs and tingling in his right hand.  Objective: Vital Signs: There were no vitals taken for this visit.  Physical Exam He is alert and oriented 3. Ortho Exam Emanation of his right hip shows pain with internal rotation rotation and pain in the groin area. He has stiffness with this motion. He cannot cross his legs well. His right knee exam is entirely normal. It is ligamentously stable.'s and McMurray's and Lachman's are negative. There is no effusion. He has excellent range of motion knee. His patella tracks well. Examination of his right wrist shows pain over the triangular fiber cartilage complex and only the ulnar side with extreme of internal and external rotation. His neurovascular intact. His radial side of his wrist is normal. Specialty Comments:  No specialty comments available.  Imaging: Xr Hip Unilat W Or W/o Pelvis 1v Right  Result Date: 07/10/2016 An AP pelvis and a lateral of his right hip  shows osteoarthritis of right hip. There is joint space narrowing, periarticular osteophytes, sclerotic and cystic changes.  Xr Knee 1-2 Views Right  Result Date: 07/10/2016 An AP and lateral of his right knee are obtained. I see no malalignment issues. There is no evidence of fracture. His joint spaces are well maintained. There is no effusion. There is no acute irregularities of the bone.    PMFS History: Patient Active Problem List   Diagnosis Date Noted  . Snoring 11/01/2015  . Hypersomnia 10/31/2015  . Degenerative arthritis of hip 09/19/2011   Past Medical History:  Diagnosis Date  . Arthritis    bilateral hips  . PONV  (postoperative nausea and vomiting)   . Strep throat 09/17/11   on Zpac    No family history on file.  Past Surgical History:  Procedure Laterality Date  . HERNIA REPAIR     left inguinal   . TOTAL HIP ARTHROPLASTY  09/19/2011   Procedure: TOTAL HIP ARTHROPLASTY ANTERIOR APPROACH;  Surgeon: Kathryne Hitchhristopher Y Talita Recht, MD;  Location: [facility];  Service: Orthopedics;  Laterality: Left;  . WISDOM TOOTH EXTRACTION     Social History   Occupational History  . Not on file.   Social History Main Topics  . Smoking status: Former Smoker    Years: 20.00    Types: Cigarettes    Quit date: 09/03/2011  . Smokeless tobacco: Never Used  . Alcohol use 1.8 oz/week    3 Glasses of wine per week  . Drug use: No  . Sexual activity: Not on file

## 2017-03-25 ENCOUNTER — Telehealth (INDEPENDENT_AMBULATORY_CARE_PROVIDER_SITE_OTHER): Payer: Self-pay | Admitting: Orthopaedic Surgery

## 2017-03-25 NOTE — Telephone Encounter (Signed)
OV NOTE FAXED TO [facility]

## 2018-09-27 ENCOUNTER — Encounter (INDEPENDENT_AMBULATORY_CARE_PROVIDER_SITE_OTHER): Payer: Self-pay | Admitting: Orthopaedic Surgery

## 2018-09-27 ENCOUNTER — Ambulatory Visit (INDEPENDENT_AMBULATORY_CARE_PROVIDER_SITE_OTHER): Payer: 59 | Admitting: Orthopaedic Surgery

## 2018-09-27 ENCOUNTER — Ambulatory Visit (INDEPENDENT_AMBULATORY_CARE_PROVIDER_SITE_OTHER): Payer: Self-pay

## 2018-09-27 DIAGNOSIS — M1611 Unilateral primary osteoarthritis, right hip: Secondary | ICD-10-CM | POA: Diagnosis not present

## 2018-09-27 DIAGNOSIS — M5441 Lumbago with sciatica, right side: Secondary | ICD-10-CM

## 2018-09-27 DIAGNOSIS — M25551 Pain in right hip: Secondary | ICD-10-CM | POA: Diagnosis not present

## 2018-09-27 DIAGNOSIS — G8929 Other chronic pain: Secondary | ICD-10-CM

## 2018-09-27 NOTE — Progress Notes (Signed)
Office Visit Note   Patient: [name]           Date of Birth: [birth year]           MRN: [id number] Visit Date: 09/27/2018              Requested by: Verl Bangs, MD [address] [place], [place] [address] PCP: Verl Bangs, MD   Assessment & Plan: Visit Diagnoses:  1. Pain in right hip   2. Chronic right-sided low back pain with right-sided sciatica   3. Unilateral primary osteoarthritis, right hip     Plan: At this point I have recommended a right total hip arthroplasty.  I explained the rationale behind this as well as the risk and benefits of this.  He is going to think about this.  I do feel this would help his posture and his back as well.  Certainly I think the worst case scenario for his lumbar spine to be core strengthening exercises and potentially facet joint injections but I do feel most of his issues come from the arthritis in his right hip.  We talked in length about this.  All question concerns were answered and addressed.  He will call us back when he decides to have any other type of intervention.  Follow-Up Instructions: Return if symptoms worsen or fail to improve.   Orders:  Orders Placed This Encounter  Procedures  . XR HIP UNILAT W OR W/O PELVIS 1V RIGHT  . XR Lumbar Spine 2-3 Views   No orders of the defined types were placed in this encounter.     Procedures: No procedures performed   Clinical Data: No additional findings.   Subjective: Chief Complaint  Patient presents with  . Right Hip - Pain  . Lower Back - Pain  The patient is well-known to me.  We performed a left total hip arthroplasty on him and I believe 2013.  He is now been having pain is going on his right side.  We have seen him before for this but now is getting worse.  He started to get stiffness quite a bit in that right leg.  He has trouble putting on his shoes and socks and even crossing his leg on that side.  He said his left hip is been doing well.   He is lost about 10 to 15 pounds inside seen him last.  He has no active medical issues and no other changes in medical status.  He has been having low back pain just across his low back with no radicular components that he describes.  He is otherwise healthy 53 year old.  HPI  Review of Systems He currently denies any headache, chest pain, shortness of breath, fever, chills, nausea, vomiting  Objective: Vital Signs: There were no vitals taken for this visit.  Physical Exam He is alert and orient x3 and in no acute distress Ortho Exam Examination of his left hip shows that he moves fluidly examination of his right hip shows significant stiffness with internal and external rotation as well as pain in the groin.  His leg lengths appear equal.  He has pain just in the facet joints of his lumbar spine with normal strength in his bowel or extremities and no numbness and tingling and no weakness. Specialty Comments:  No specialty comments available.  Imaging: Xr Hip Unilat W Or W/o Pelvis 1v Right  Result Date: 09/27/2018 An AP pelvis and lateral the right hip shows significant right hip joint space  narrowing that is worsened over the years with comparison films.  There is also large para-articular osteophytes around the hip that were not present before.  There is a left total hip arthroplasty.  Xr Lumbar Spine 2-3 Views  Result Date: 09/27/2018 2 views lumbar spine show normal-appearing alignment and normal-appearing disc spaces with no acute findings.    PMFS History: Patient Active Problem List   Diagnosis Date Noted  . Snoring 11/01/2015  . Hypersomnia 10/31/2015  . Degenerative arthritis of hip 09/19/2011   Past Medical History:  Diagnosis Date  . Arthritis    bilateral hips  . PONV (postoperative nausea and vomiting)   . Strep throat 09/17/11   on Zpac    History reviewed. No pertinent family history.  Past Surgical History:  Procedure Laterality Date  . HERNIA REPAIR      left inguinal   . TOTAL HIP ARTHROPLASTY  09/19/2011   Procedure: TOTAL HIP ARTHROPLASTY ANTERIOR APPROACH;  Surgeon: Kathryne Hitch, MD;  Location: [facility];  Service: Orthopedics;  Laterality: Left;  . WISDOM TOOTH EXTRACTION     Social History   Occupational History  . Not on file  Tobacco Use  . Smoking status: Former Smoker    Years: 20.00    Types: Cigarettes    Last attempt to quit: 09/03/2011    Years since quitting: 7.0  . Smokeless tobacco: Never Used  Substance and Sexual Activity  . Alcohol use: Yes    Alcohol/week: 3.0 standard drinks    Types: 3 Glasses of wine per week  . Drug use: No  . Sexual activity: Not on file

## 2020-04-25 ENCOUNTER — Ambulatory Visit: Payer: Self-pay

## 2020-04-25 ENCOUNTER — Ambulatory Visit (INDEPENDENT_AMBULATORY_CARE_PROVIDER_SITE_OTHER): Payer: 59 | Admitting: Orthopaedic Surgery

## 2020-04-25 VITALS — Ht 71.0 in | Wt 198.0 lb

## 2020-04-25 DIAGNOSIS — M1611 Unilateral primary osteoarthritis, right hip: Secondary | ICD-10-CM

## 2020-04-25 DIAGNOSIS — M25551 Pain in right hip: Secondary | ICD-10-CM | POA: Diagnosis not present

## 2020-04-25 NOTE — Progress Notes (Signed)
Office Visit Note   Patient: [name]           Date of Birth: 22-Mar-1966           MRN: [id number] Visit Date: 04/25/2020              Requested by: Verl Bangs, MD [address] [place],  [place] [address] PCP: Verl Bangs, MD   Assessment & Plan: Visit Diagnoses:  1. Pain in right hip   2. Unilateral primary osteoarthritis, right hip     Plan: I agree with the need for hip replacement surgery on the right side at this point given his signs and symptoms combined with his clinical exam findings and x-ray findings.  We had a thorough discussion about the replacement surgery.  He is fully aware of the risk and benefits of the surgery having had it done before his left side.  He would like to have this done sometime in the month of November which I think is reasonable.  All questions and concerns were answered and addressed.  We will be in touch about scheduling surgery.  Follow-Up Instructions: Return for 2 weeks post-op.   Orders:  Orders Placed This Encounter  Procedures   XR HIP UNILAT W OR W/O PELVIS 1V RIGHT   No orders of the defined types were placed in this encounter.     Procedures: No procedures performed   Clinical Data: No additional findings.   Subjective: Chief Complaint  Patient presents with   Right Hip - Pain  [name] is a 54 year old active gentleman with debilitating arthritis and pain involving his right hip.  This is been well-documented time.  Actually replaced his left hip in 2013.  At this point his right hip pain is detrimentally affecting his mobility, his quality of life and his activities day living.  He is starting to have some back issues.  The pain is waking up at night.  He has tried failed conservative treatment for several years now.  I have been following him for this right hip for several years.  At this point he does feel that the pain is enough to the point where he would like to proceed with a hip  replacement on the right side.  He denies any active changes in medical status.  He is not a smoker not a diabetic.  HPI  Review of Systems There is currently no listed headache, chest pain, shortness of breath, fever, chills, nausea, vomiting  Objective: Vital Signs: Ht 5\' 11"  (1.803 m)    Wt 198 lb (89.8 kg)    BMI 27.62 kg/m   Physical Exam He is alert and orient x3 and in no acute distress Ortho Exam Examination of his right hip show significant stiffness with internal and external rotation with also significant pain with rotation.  His left hip moves smoothly. Specialty Comments:  No specialty comments available.  Imaging: XR HIP UNILAT W OR W/O PELVIS 1V RIGHT  Result Date: 04/25/2020 An AP pelvis lateral the right hip shows end-stage arthritis of the right hip.  There are large para-articular osteophytes as well as joint space narrowing.  There are sclerotic changes in the femoral head with slight flattening.  There are sclerotic changes in the acetabulum.  There is a total hip arthroplasty on the left side but does have some heterotopic bone that has been documented before.    PMFS History: Patient Active Problem List   Diagnosis Date Noted   Snoring 11/01/2015  Hypersomnia 10/31/2015   Degenerative arthritis of hip 09/19/2011   Past Medical History:  Diagnosis Date   Arthritis    bilateral hips   PONV (postoperative nausea and vomiting)    Strep throat 09/17/11   on Zpac    No family history on file.  Past Surgical History:  Procedure Laterality Date   HERNIA REPAIR     left inguinal    TOTAL HIP ARTHROPLASTY  09/19/2011   Procedure: TOTAL HIP ARTHROPLASTY ANTERIOR APPROACH;  Surgeon: Kathryne Hitch, MD;  Location: [facility];  Service: Orthopedics;  Laterality: Left;   WISDOM TOOTH EXTRACTION     Social History   Occupational History   Not on file  Tobacco Use   Smoking status: Former Smoker    Years: 20.00    Types: Cigarettes    Quit  date: 09/03/2011    Years since quitting: 8.6   Smokeless tobacco: Never Used  Substance and Sexual Activity   Alcohol use: Yes    Alcohol/week: 3.0 standard drinks    Types: 3 Glasses of wine per week   Drug use: No   Sexual activity: Not on file

## 2020-05-01 ENCOUNTER — Telehealth: Payer: Self-pay | Admitting: Orthopaedic Surgery

## 2020-05-01 NOTE — Telephone Encounter (Signed)
[name] with [facility] called stating she received some X-Rays from our office with her name on it as if she ordered the x-rays however jennifer has never seen this pt and doesn't not even have a chart for them. [name] would like a CB to make sure this situation gets fixed  [facility] CB# [phone]

## 2020-05-04 NOTE — Telephone Encounter (Signed)
Lvm informing her that a ticket had been put in to have this corrected

## 2020-05-04 NOTE — Telephone Encounter (Signed)
Looking into this.

## 2020-05-08 NOTE — Telephone Encounter (Signed)
Resolved

## 2020-05-14 ENCOUNTER — Encounter: Payer: Self-pay | Admitting: Orthopaedic Surgery

## 2020-06-08 ENCOUNTER — Telehealth: Payer: Self-pay | Admitting: Orthopaedic Surgery

## 2020-06-08 NOTE — Telephone Encounter (Signed)
Patient submitted medical release form, [employer] short term disability paperwork, and $25.00 cash payment to [employer]. Accepted 06/08/20

## 2020-06-13 ENCOUNTER — Other Ambulatory Visit: Payer: Self-pay

## 2020-06-22 ENCOUNTER — Other Ambulatory Visit: Payer: Self-pay | Admitting: Physician Assistant

## 2020-06-22 NOTE — Progress Notes (Signed)
Need orders in epic.  

## 2020-06-25 NOTE — Progress Notes (Signed)
DUE TO COVID-19 ONLY ONE VISITOR IS ALLOWED TO COME WITH YOU AND STAY IN THE WAITING ROOM ONLY DURING PRE OP AND PROCEDURE DAY OF SURGERY. THE 1 VISITOR  MAY VISIT WITH YOU AFTER SURGERY IN YOUR PRIVATE ROOM DURING VISITING HOURS ONLY!  YOU NEED TO HAVE A COVID 19 TEST ON__12/01/2020 _____ @_______ , THIS TEST MUST BE DONE BEFORE SURGERY,  COVID TESTING SITE [address] [place] , IT IS ON THE RIGHT GOING OUT [address] APPROXIMATELY  2 MINUTES PAST [employer] ON THE RIGHT. ONCE YOUR COVID TEST IS COMPLETED,  PLEASE BEGIN THE QUARANTINE INSTRUCTIONS AS OUTLINED IN YOUR HANDOUT.                [name]  06/25/2020   Your procedure is scheduled on: 06/29/2020   Report to [facility] Main  Entrance   Report to admitting at    0945 AM     Call this number if you have problems the morning of surgery [phone]    REMEMBER: NO  SOLID FOOD CANDY OR GUM AFTER MIDNIGHT. CLEAR LIQUIDS UNTIL  0915am       . NOTHING BY MOUTH EXCEPT CLEAR LIQUIDS UNTIL    . PLEASE FINISH G2 DRINK PER SURGEON ORDER  WHICH NEEDS TO BE COMPLETED AT  0915am     .      CLEAR LIQUID DIET   Foods Allowed                                                                    Coffee and tea, regular and decaf                            Fruit ices (not with fruit pulp)                                      Iced Popsicles                                    Carbonated beverages, regular and diet                                    Cranberry, grape and apple juices Sports drinks like Gatorade Lightly seasoned clear broth or consume(fat free) Sugar, honey syrup ___________________________________________________________________      BRUSH YOUR TEETH MORNING OF SURGERY AND RINSE YOUR MOUTH OUT, NO CHEWING GUM CANDY OR MINTS.     Take these medicines the morning of surgery with A SIP OF WATER: Eye drops as usual  DO NOT TAKE ANY DIABETIC MEDICATIONS DAY OF YOUR SURGERY                                You may not have any metal on your body including hair pins and              piercings  Do not wear  jewelry, make-up, lotions, powders or perfumes, deodorant             Do not wear nail polish on your fingernails.  Do not shave  48 hours prior to surgery.              Men may shave face and neck.   Do not bring valuables to the hospital. [place].  Contacts, dentures or bridgework may not be worn into surgery.  Leave suitcase in the car. After surgery it may be brought to your room.     Patients discharged the day of surgery will not be allowed to drive home. IF YOU ARE HAVING SURGERY AND GOING HOME THE SAME DAY, YOU MUST HAVE AN ADULT TO DRIVE YOU HOME AND BE WITH YOU FOR 24 HOURS. YOU MAY GO HOME BY TAXI OR UBER OR ORTHERWISE, BUT AN ADULT MUST ACCOMPANY YOU HOME AND STAY WITH YOU FOR 24 HOURS.  Name and phone number of your driver:  Special Instructions: N/A              Please read over the following fact sheets you were given: _____________________________________________________________________  [facility] - Preparing for Surgery Before surgery, you can play an important role.  Because skin is not sterile, your skin needs to be as free of germs as possible.  You can reduce the number of germs on your skin by washing with CHG (chlorahexidine gluconate) soap before surgery.  CHG is an antiseptic cleaner which kills germs and bonds with the skin to continue killing germs even after washing. Please DO NOT use if you have an allergy to CHG or antibacterial soaps.  If your skin becomes reddened/irritated stop using the CHG and inform your nurse when you arrive at Short Stay. Do not shave (including legs and underarms) for at least 48 hours prior to the first CHG shower.  You may shave your face/neck. Please follow these instructions carefully:  1.  Shower with CHG Soap the night before surgery and the  morning of  Surgery.  2.  If you choose to wash your hair, wash your hair first as usual with your  normal  shampoo.  3.  After you shampoo, rinse your hair and body thoroughly to remove the  shampoo.                           4.  Use CHG as you would any other liquid soap.  You can apply chg directly  to the skin and wash                       Gently with a scrungie or clean washcloth.  5.  Apply the CHG Soap to your body ONLY FROM THE NECK DOWN.   Do not use on face/ open                           Wound or open sores. Avoid contact with eyes, ears mouth and genitals (private parts).                       Wash face,  Genitals (private parts) with your normal soap.             6.  Wash thoroughly, paying special  attention to the area where your surgery  will be performed.  7.  Thoroughly rinse your body with warm water from the neck down.  8.  DO NOT shower/wash with your normal soap after using and rinsing off  the CHG Soap.                9.  Pat yourself dry with a clean towel.            10.  Wear clean pajamas.            11.  Place clean sheets on your bed the night of your first shower and do not  sleep with pets. Day of Surgery : Do not apply any lotions/deodorants the morning of surgery.  Please wear clean clothes to the hospital/surgery center.  FAILURE TO FOLLOW THESE INSTRUCTIONS MAY RESULT IN THE CANCELLATION OF YOUR SURGERY PATIENT SIGNATURE_________________________________  NURSE SIGNATURE__________________________________  ________________________________________________________________________

## 2020-06-26 ENCOUNTER — Encounter (HOSPITAL_COMMUNITY)
Admission: RE | Admit: 2020-06-26 | Discharge: 2020-06-26 | Disposition: A | Discharge status: Home / Self Care | Payer: 59 | Source: Ambulatory Visit | Attending: Orthopaedic Surgery | Admitting: Orthopaedic Surgery

## 2020-06-26 ENCOUNTER — Other Ambulatory Visit (HOSPITAL_COMMUNITY)
Admission: RE | Admit: 2020-06-26 | Discharge: 2020-06-26 | Disposition: A | Discharge status: Home / Self Care | Payer: 59 | Source: Ambulatory Visit | Attending: Orthopaedic Surgery | Admitting: Orthopaedic Surgery

## 2020-06-26 ENCOUNTER — Encounter (HOSPITAL_COMMUNITY): Payer: Self-pay

## 2020-06-26 ENCOUNTER — Other Ambulatory Visit: Payer: Self-pay

## 2020-06-26 DIAGNOSIS — Z01818 Encounter for other preprocedural examination: Secondary | ICD-10-CM | POA: Insufficient documentation

## 2020-06-26 DIAGNOSIS — Z20822 Contact with and (suspected) exposure to covid-19: Secondary | ICD-10-CM | POA: Diagnosis not present

## 2020-06-26 HISTORY — DX: Anxiety disorder, unspecified: F41.9

## 2020-06-26 HISTORY — DX: Sleep apnea, unspecified: G47.30

## 2020-06-26 HISTORY — DX: Type 2 diabetes mellitus without complications: E11.9

## 2020-06-26 LAB — SURGICAL PCR SCREEN
MRSA, PCR: NEGATIVE
Staphylococcus aureus: NEGATIVE

## 2020-06-26 LAB — BASIC METABOLIC PANEL
Anion gap: 8 (ref 5–15)
BUN: 18 mg/dL (ref 6–20)
CO2: 26 mmol/L (ref 22–32)
Calcium: 9.6 mg/dL (ref 8.9–10.3)
Chloride: 102 mmol/L (ref 98–111)
Creatinine, Ser: 0.91 mg/dL (ref 0.61–1.24)
GFR, Estimated: >60 mL/min (ref >60)
Glucose, Bld: 210 mg/dL — ABNORMAL HIGH (ref 70–99)
Potassium: 4.5 mmol/L (ref 3.5–5.1)
Sodium: 136 mmol/L (ref 135–145)

## 2020-06-26 LAB — CBC
HCT: 42.5 % (ref 39.0–52.0)
Hemoglobin: 14.8 g/dL (ref 13.0–17.0)
MCH: 31.7 pg (ref 26.0–34.0)
MCHC: 34.8 g/dL (ref 30.0–36.0)
MCV: 91 fL (ref 80.0–100.0)
Platelets: 160 10*3/uL (ref 150–400)
RBC: 4.67 MIL/uL (ref 4.22–5.81)
RDW: 12.9 % (ref 11.5–15.5)
WBC: 5.4 10*3/uL (ref 4.0–10.5)
nRBC: 0 % (ref 0.0–0.2)

## 2020-06-26 LAB — SARS CORONAVIRUS 2 (TAT 6-24 HRS): SARS Coronavirus 2: NEGATIVE

## 2020-06-26 LAB — GLUCOSE, CAPILLARY: Glucose-Capillary: 236 mg/dL — ABNORMAL HIGH (ref 70–99)

## 2020-06-26 LAB — HEMOGLOBIN A1C
Hgb A1c MFr Bld: 7.1 % — ABNORMAL HIGH (ref 4.8–5.6)
Mean Plasma Glucose: 157.07 mg/dL

## 2020-06-28 DIAGNOSIS — M1611 Unilateral primary osteoarthritis, right hip: Secondary | ICD-10-CM

## 2020-06-28 NOTE — H&P (Signed)
TOTAL HIP ADMISSION H&P  Patient is admitted for right total hip arthroplasty.  Subjective:  Chief Complaint: right hip pain  HPI: [name], 54 y.o. male, has a history of pain and functional disability in the right hip(s) due to arthritis and patient has failed non-surgical conservative treatments for greater than 12 weeks to include NSAID's and/or analgesics, flexibility and strengthening excercises and activity modification.  Onset of symptoms was gradual starting 1 years ago with gradually worsening course since that time.The patient noted no past surgery on the right hip(s).  Patient currently rates pain in the right hip at 10 out of 10 with activity. Patient has night pain, worsening of pain with activity and weight bearing, pain that interfers with activities of daily living and pain with passive range of motion. Patient has evidence of subchondral cysts, subchondral sclerosis, periarticular osteophytes and joint space narrowing by imaging studies. This condition presents safety issues increasing the risk of falls.  There is no current active infection.  Patient Active Problem List   Diagnosis Date Noted  . Unilateral primary osteoarthritis, right hip 06/28/2020  . Snoring 11/01/2015  . Hypersomnia 10/31/2015  . Degenerative arthritis of hip 09/19/2011   Past Medical History:  Diagnosis Date  . Anxiety   . Arthritis    bilateral hips  . Diabetes mellitus without complication (HCC)    type 2   . PONV (postoperative nausea and vomiting)   . Sleep apnea   . Strep throat 09/17/11   on Zpac    Past Surgical History:  Procedure Laterality Date  . HERNIA REPAIR     left inguinal   . NECK SURGERY    . TOTAL HIP ARTHROPLASTY  09/19/2011   Procedure: TOTAL HIP ARTHROPLASTY ANTERIOR APPROACH;  Surgeon: Kathryne Hitch, MD;  Location: [facility];  Service: Orthopedics;  Laterality: Left;  . WISDOM TOOTH EXTRACTION      No current facility-administered medications for this  encounter.   Current Outpatient Medications  Medication Sig Dispense Refill Last Dose  . atorvastatin (LIPITOR) 40 MG tablet Take 40 mg by mouth daily before breakfast.     . cetirizine (ZYRTEC) 10 MG tablet Take 10 mg by mouth daily.     . metFORMIN (GLUCOPHAGE) 1000 MG tablet Take 1,000 mg by mouth in the morning and at bedtime.     . naproxen sodium (ANAPROX) 220 MG tablet Take 220 mg by mouth 2 (two) times daily with a meal.     . timolol (TIMOPTIC) 0.5 % ophthalmic solution Place 1 drop into both eyes 2 (two) times daily.       No Known Allergies  Social History   Tobacco Use  . Smoking status: Former Smoker    Years: 20.00    Types: Cigarettes    Quit date: 09/03/2011    Years since quitting: 8.8  . Smokeless tobacco: Never Used  Substance Use Topics  . Alcohol use: Yes    Comment: occas     No family history on file.   Review of Systems  Musculoskeletal: Positive for gait problem.  All other systems reviewed and are negative.   Objective:  Physical Exam Vitals reviewed.  Constitutional:      Appearance: Normal appearance.  HENT:     Head: Normocephalic and atraumatic.  Eyes:     Extraocular Movements: Extraocular movements intact.     Pupils: Pupils are equal, round, and reactive to light.  Cardiovascular:     Rate and Rhythm: Normal rate.  Pulses: Normal pulses.     Heart sounds: Normal heart sounds.  Pulmonary:     Breath sounds: Normal breath sounds.  Abdominal:     Palpations: Abdomen is soft.  Musculoskeletal:     Cervical back: Normal range of motion.     Right hip: Tenderness and bony tenderness present. Decreased range of motion. Decreased strength.  Neurological:     Mental Status: He is alert and oriented to person, place, and time.  Psychiatric:        Behavior: Behavior normal.     Vital signs in last 24 hours:    Labs:   Estimated body mass index is 28.93 kg/m as calculated from the following:   Height as of 06/26/20: 5\' 11"   (1.803 m).   Weight as of 06/26/20: 94.1 kg.   Imaging Review Plain radiographs demonstrate severe degenerative joint disease of the right hip(s). The bone quality appears to be excellent for age and reported activity level.      Assessment/Plan:  End stage arthritis, right hip(s)  The patient history, physical examination, clinical judgement of the provider and imaging studies are consistent with end stage degenerative joint disease of the right hip(s) and total hip arthroplasty is deemed medically necessary. The treatment options including medical management, injection therapy, arthroscopy and arthroplasty were discussed at length. The risks and benefits of total hip arthroplasty were presented and reviewed. The risks due to aseptic loosening, infection, stiffness, dislocation/subluxation,  thromboembolic complications and other imponderables were discussed.  The patient acknowledged the explanation, agreed to proceed with the plan and consent was signed. Patient is being admitted for inpatient treatment for surgery, pain control, PT, OT, prophylactic antibiotics, VTE prophylaxis, progressive ambulation and ADL's and discharge planning.The patient is planning to be discharged home with home health services

## 2020-06-29 ENCOUNTER — Ambulatory Visit (HOSPITAL_COMMUNITY): Payer: 59

## 2020-06-29 ENCOUNTER — Encounter (HOSPITAL_COMMUNITY): Payer: Self-pay | Admitting: Orthopaedic Surgery

## 2020-06-29 ENCOUNTER — Encounter (HOSPITAL_COMMUNITY)
Admission: RE | Disposition: A | Discharge status: Home / Self Care | Payer: Self-pay | Source: Home / Self Care | Attending: Orthopaedic Surgery

## 2020-06-29 ENCOUNTER — Observation Stay (HOSPITAL_COMMUNITY)
Admission: RE | Admit: 2020-06-29 | Discharge: 2020-06-30 | Disposition: A | Discharge status: Home / Self Care | Payer: 59 | Attending: Orthopaedic Surgery | Admitting: Orthopaedic Surgery

## 2020-06-29 ENCOUNTER — Observation Stay (HOSPITAL_COMMUNITY): Payer: 59

## 2020-06-29 ENCOUNTER — Other Ambulatory Visit: Payer: Self-pay

## 2020-06-29 ENCOUNTER — Ambulatory Visit (HOSPITAL_COMMUNITY): Payer: 59 | Admitting: Anesthesiology

## 2020-06-29 ENCOUNTER — Telehealth (HOSPITAL_COMMUNITY): Payer: Self-pay | Admitting: *Deleted

## 2020-06-29 DIAGNOSIS — E119 Type 2 diabetes mellitus without complications: Secondary | ICD-10-CM | POA: Diagnosis not present

## 2020-06-29 DIAGNOSIS — Z96641 Presence of right artificial hip joint: Secondary | ICD-10-CM

## 2020-06-29 DIAGNOSIS — Z96642 Presence of left artificial hip joint: Secondary | ICD-10-CM | POA: Insufficient documentation

## 2020-06-29 DIAGNOSIS — M25551 Pain in right hip: Secondary | ICD-10-CM | POA: Diagnosis present

## 2020-06-29 DIAGNOSIS — S72041A Displaced fracture of base of neck of right femur, initial encounter for closed fracture: Secondary | ICD-10-CM

## 2020-06-29 DIAGNOSIS — M1611 Unilateral primary osteoarthritis, right hip: Secondary | ICD-10-CM

## 2020-06-29 DIAGNOSIS — Z87891 Personal history of nicotine dependence: Secondary | ICD-10-CM | POA: Insufficient documentation

## 2020-06-29 DIAGNOSIS — Z7984 Long term (current) use of oral hypoglycemic drugs: Secondary | ICD-10-CM | POA: Diagnosis not present

## 2020-06-29 HISTORY — PX: TOTAL HIP ARTHROPLASTY: SHX124

## 2020-06-29 LAB — TYPE AND SCREEN
ABO/RH(D): O POS
Antibody Screen: NEGATIVE

## 2020-06-29 LAB — GLUCOSE, CAPILLARY
Glucose-Capillary: 153 mg/dL — ABNORMAL HIGH (ref 70–99)
Glucose-Capillary: 142 mg/dL — ABNORMAL HIGH (ref 70–99)
Glucose-Capillary: 170 mg/dL — ABNORMAL HIGH (ref 70–99)
Glucose-Capillary: 233 mg/dL — ABNORMAL HIGH (ref 70–99)

## 2020-06-29 SURGERY — ARTHROPLASTY, HIP, TOTAL, ANTERIOR APPROACH
Anesthesia: General | Site: Hip | Laterality: Right

## 2020-06-29 MED ORDER — FENTANYL CITRATE (PF) 100 MCG/2ML IJ SOLN
INTRAMUSCULAR | Status: DC | PRN
  Administered 2020-06-29 (×4): 50 ug via INTRAVENOUS

## 2020-06-29 MED ORDER — MIDAZOLAM HCL 2 MG/2ML IJ SOLN
INTRAMUSCULAR | Status: AC
  Filled 2020-06-29: qty 2

## 2020-06-29 MED ORDER — ONDANSETRON HCL 4 MG/2ML IJ SOLN
4.0000 mg | Freq: Four times a day (QID) | INTRAMUSCULAR | Status: DC | PRN
  Administered 2020-06-29: 4 mg via INTRAVENOUS
  Filled 2020-06-29: qty 2

## 2020-06-29 MED ORDER — MENTHOL 3 MG MT LOZG: 1.0000 | LOZENGE | OROMUCOSAL | Status: DC | PRN

## 2020-06-29 MED ORDER — FENTANYL CITRATE (PF) 100 MCG/2ML IJ SOLN
INTRAMUSCULAR | Status: AC
  Filled 2020-06-29: qty 2

## 2020-06-29 MED ORDER — HYDROMORPHONE HCL 1 MG/ML IJ SOLN: 0.5000 mg | INTRAMUSCULAR | Status: DC | PRN

## 2020-06-29 MED ORDER — POVIDONE-IODINE 10 % EX SWAB
2.0000 "application " | Freq: Once | CUTANEOUS | Status: AC
  Administered 2020-06-29: 2 via TOPICAL

## 2020-06-29 MED ORDER — CEFAZOLIN SODIUM-DEXTROSE 1-4 GM/50ML-% IV SOLN
1.0000 g | Freq: Four times a day (QID) | INTRAVENOUS | Status: AC
  Administered 2020-06-29 – 2020-06-30 (×2): 1 g via INTRAVENOUS
  Filled 2020-06-29 (×2): qty 50

## 2020-06-29 MED ORDER — METOCLOPRAMIDE HCL 5 MG/ML IJ SOLN: 5.0000 mg | Freq: Three times a day (TID) | INTRAMUSCULAR | Status: DC | PRN

## 2020-06-29 MED ORDER — ALUM & MAG HYDROXIDE-SIMETH 200-200-20 MG/5ML PO SUSP: 30.0000 mL | ORAL | Status: DC | PRN

## 2020-06-29 MED ORDER — DEXAMETHASONE SODIUM PHOSPHATE 10 MG/ML IJ SOLN
INTRAMUSCULAR | Status: DC | PRN
  Administered 2020-06-29: 10 mg via INTRAVENOUS

## 2020-06-29 MED ORDER — DEXAMETHASONE SODIUM PHOSPHATE 10 MG/ML IJ SOLN
INTRAMUSCULAR | Status: AC
  Filled 2020-06-29: qty 1

## 2020-06-29 MED ORDER — METHOCARBAMOL 500 MG PO TABS
500.0000 mg | ORAL_TABLET | Freq: Four times a day (QID) | ORAL | Status: DC | PRN
  Administered 2020-06-29: 500 mg via ORAL
  Filled 2020-06-29: qty 1

## 2020-06-29 MED ORDER — PROPOFOL 10 MG/ML IV BOLUS
INTRAVENOUS | Status: AC
  Filled 2020-06-29: qty 20

## 2020-06-29 MED ORDER — BUPIVACAINE IN DEXTROSE 0.75-8.25 % IT SOLN
INTRATHECAL | Status: DC | PRN
  Administered 2020-06-29: 1.8 mL via INTRATHECAL

## 2020-06-29 MED ORDER — POLYETHYLENE GLYCOL 3350 17 G PO PACK
17.0000 g | PACK | Freq: Every day | ORAL | Status: DC | PRN
Start: 2020-06-29 — End: 2020-06-30

## 2020-06-29 MED ORDER — ONDANSETRON HCL 4 MG/2ML IJ SOLN
INTRAMUSCULAR | Status: AC
  Filled 2020-06-29: qty 2

## 2020-06-29 MED ORDER — CEFAZOLIN SODIUM-DEXTROSE 2-4 GM/100ML-% IV SOLN
2.0000 g | INTRAVENOUS | Status: AC
  Administered 2020-06-29: 2 g via INTRAVENOUS
  Filled 2020-06-29: qty 100

## 2020-06-29 MED ORDER — 0.9 % SODIUM CHLORIDE (POUR BTL) OPTIME
TOPICAL | Status: DC | PRN
  Administered 2020-06-29: 1000 mL

## 2020-06-29 MED ORDER — ATORVASTATIN CALCIUM 40 MG PO TABS
40.0000 mg | ORAL_TABLET | Freq: Every day | ORAL | Status: DC
  Administered 2020-06-30: 08:00:00 40 mg via ORAL
  Filled 2020-06-29: qty 1

## 2020-06-29 MED ORDER — ORAL CARE MOUTH RINSE: 15.0000 mL | Freq: Once | OROMUCOSAL | Status: AC

## 2020-06-29 MED ORDER — CHLORHEXIDINE GLUCONATE 0.12 % MT SOLN
15.0000 mL | Freq: Once | OROMUCOSAL | Status: AC
  Administered 2020-06-29: 15 mL via OROMUCOSAL

## 2020-06-29 MED ORDER — ASPIRIN 81 MG PO CHEW
81.0000 mg | CHEWABLE_TABLET | Freq: Two times a day (BID) | ORAL | Status: DC
  Administered 2020-06-29 – 2020-06-30 (×2): 81 mg via ORAL
  Filled 2020-06-29 (×2): qty 1

## 2020-06-29 MED ORDER — PANTOPRAZOLE SODIUM 40 MG PO TBEC
40.0000 mg | DELAYED_RELEASE_TABLET | Freq: Every day | ORAL | Status: DC
  Administered 2020-06-29 – 2020-06-30 (×2): 40 mg via ORAL
  Filled 2020-06-29 (×2): qty 1

## 2020-06-29 MED ORDER — PHENYLEPHRINE HCL (PRESSORS) 10 MG/ML IV SOLN
INTRAVENOUS | Status: AC
  Filled 2020-06-29: qty 1

## 2020-06-29 MED ORDER — PROPOFOL 10 MG/ML IV BOLUS
INTRAVENOUS | Status: DC | PRN
  Administered 2020-06-29: 20 mg via INTRAVENOUS

## 2020-06-29 MED ORDER — METOCLOPRAMIDE HCL 5 MG PO TABS: 5.0000 mg | ORAL_TABLET | Freq: Three times a day (TID) | ORAL | Status: DC | PRN

## 2020-06-29 MED ORDER — OXYCODONE HCL 5 MG PO TABS
5.0000 mg | ORAL_TABLET | ORAL | Status: DC | PRN
  Administered 2020-06-29: 10 mg via ORAL
  Filled 2020-06-29 (×3): qty 2

## 2020-06-29 MED ORDER — DOCUSATE SODIUM 100 MG PO CAPS
100.0000 mg | ORAL_CAPSULE | Freq: Two times a day (BID) | ORAL | Status: DC
  Administered 2020-06-29 – 2020-06-30 (×2): 100 mg via ORAL
  Filled 2020-06-29 (×2): qty 1

## 2020-06-29 MED ORDER — PROPOFOL 1000 MG/100ML IV EMUL
INTRAVENOUS | Status: AC
  Filled 2020-06-29: qty 100

## 2020-06-29 MED ORDER — PROPOFOL 500 MG/50ML IV EMUL
INTRAVENOUS | Status: DC | PRN
  Administered 2020-06-29: 75 ug/kg/min via INTRAVENOUS

## 2020-06-29 MED ORDER — HYDROMORPHONE HCL 1 MG/ML IJ SOLN
0.2500 mg | INTRAMUSCULAR | Status: DC | PRN
  Administered 2020-06-29 (×3): 0.5 mg via INTRAVENOUS

## 2020-06-29 MED ORDER — METHOCARBAMOL 500 MG IVPB - SIMPLE MED
500.0000 mg | Freq: Four times a day (QID) | INTRAVENOUS | Status: DC | PRN
  Administered 2020-06-29: 500 mg via INTRAVENOUS
  Filled 2020-06-29: qty 50

## 2020-06-29 MED ORDER — SODIUM CHLORIDE 0.9 % IV SOLN: INTRAVENOUS | Status: DC

## 2020-06-29 MED ORDER — ACETAMINOPHEN 500 MG PO TABS
1000.0000 mg | ORAL_TABLET | Freq: Once | ORAL | Status: AC
  Administered 2020-06-29: 1000 mg via ORAL
  Filled 2020-06-29: qty 2

## 2020-06-29 MED ORDER — SODIUM CHLORIDE 0.9 % IR SOLN
Status: DC | PRN
  Administered 2020-06-29: 1000 mL

## 2020-06-29 MED ORDER — LACTATED RINGERS IV SOLN: INTRAVENOUS | Status: DC

## 2020-06-29 MED ORDER — HYDROMORPHONE HCL 1 MG/ML IJ SOLN
INTRAMUSCULAR | Status: AC
  Filled 2020-06-29: qty 2

## 2020-06-29 MED ORDER — ONDANSETRON HCL 4 MG/2ML IJ SOLN
INTRAMUSCULAR | Status: DC | PRN
  Administered 2020-06-29: 4 mg via INTRAVENOUS

## 2020-06-29 MED ORDER — DIPHENHYDRAMINE HCL 12.5 MG/5ML PO ELIX: 12.5000 mg | ORAL_SOLUTION | ORAL | Status: DC | PRN

## 2020-06-29 MED ORDER — KETOROLAC TROMETHAMINE 15 MG/ML IJ SOLN
7.5000 mg | Freq: Four times a day (QID) | INTRAMUSCULAR | Status: DC
  Administered 2020-06-29 – 2020-06-30 (×3): 7.5 mg via INTRAVENOUS
  Filled 2020-06-29 (×3): qty 1

## 2020-06-29 MED ORDER — TRANEXAMIC ACID-NACL 1000-0.7 MG/100ML-% IV SOLN
1000.0000 mg | INTRAVENOUS | Status: AC
  Administered 2020-06-29: 1000 mg via INTRAVENOUS
  Filled 2020-06-29: qty 100

## 2020-06-29 MED ORDER — ONDANSETRON HCL 4 MG PO TABS: 4.0000 mg | ORAL_TABLET | Freq: Four times a day (QID) | ORAL | Status: DC | PRN

## 2020-06-29 MED ORDER — PHENOL 1.4 % MT LIQD: 1.0000 | OROMUCOSAL | Status: DC | PRN

## 2020-06-29 MED ORDER — TIMOLOL MALEATE 0.5 % OP SOLN
1.0000 [drp] | Freq: Two times a day (BID) | OPHTHALMIC | Status: DC
  Administered 2020-06-29 – 2020-06-30 (×2): 1 [drp] via OPHTHALMIC
  Filled 2020-06-29: qty 5

## 2020-06-29 MED ORDER — ACETAMINOPHEN 325 MG PO TABS
325.0000 mg | ORAL_TABLET | Freq: Four times a day (QID) | ORAL | Status: DC | PRN
Start: 2020-06-30 — End: 2020-06-30

## 2020-06-29 MED ORDER — CELECOXIB 200 MG PO CAPS
200.0000 mg | ORAL_CAPSULE | Freq: Once | ORAL | Status: AC
  Administered 2020-06-29: 200 mg via ORAL
  Filled 2020-06-29: qty 1

## 2020-06-29 MED ORDER — INSULIN ASPART 100 UNIT/ML ~~LOC~~ SOLN
0.0000 [IU] | Freq: Every day | SUBCUTANEOUS | Status: DC
  Administered 2020-06-29: 2 [IU] via SUBCUTANEOUS

## 2020-06-29 MED ORDER — PHENYLEPHRINE HCL-NACL 10-0.9 MG/250ML-% IV SOLN
INTRAVENOUS | Status: DC | PRN
  Administered 2020-06-29: 25 ug/min via INTRAVENOUS

## 2020-06-29 MED ORDER — METHOCARBAMOL 500 MG IVPB - SIMPLE MED
INTRAVENOUS | Status: AC
  Filled 2020-06-29: qty 50

## 2020-06-29 MED ORDER — INSULIN ASPART 100 UNIT/ML ~~LOC~~ SOLN
0.0000 [IU] | Freq: Three times a day (TID) | SUBCUTANEOUS | Status: DC
  Administered 2020-06-30 (×2): 3 [IU] via SUBCUTANEOUS

## 2020-06-29 MED ORDER — OXYCODONE HCL 5 MG PO TABS
10.0000 mg | ORAL_TABLET | ORAL | Status: DC | PRN
  Administered 2020-06-29 – 2020-06-30 (×3): 10 mg via ORAL
  Filled 2020-06-29: qty 2

## 2020-06-29 MED ORDER — STERILE WATER FOR IRRIGATION IR SOLN
Status: DC | PRN
  Administered 2020-06-29: 2000 mL

## 2020-06-29 MED ORDER — MIDAZOLAM HCL 5 MG/5ML IJ SOLN
INTRAMUSCULAR | Status: DC | PRN
  Administered 2020-06-29 (×2): 1 mg via INTRAVENOUS

## 2020-06-29 MED ORDER — METFORMIN HCL 500 MG PO TABS
1000.0000 mg | ORAL_TABLET | Freq: Two times a day (BID) | ORAL | Status: DC
  Administered 2020-06-29 – 2020-06-30 (×2): 1000 mg via ORAL
  Filled 2020-06-29 (×2): qty 2

## 2020-06-29 MED ORDER — GABAPENTIN 100 MG PO CAPS
100.0000 mg | ORAL_CAPSULE | Freq: Three times a day (TID) | ORAL | Status: DC
  Administered 2020-06-29 – 2020-06-30 (×3): 100 mg via ORAL
  Filled 2020-06-29 (×3): qty 1

## 2020-06-29 SURGICAL SUPPLY — 41 items
ACETAB CUP W GRIPTION 54MM (Plate) ×1 IMPLANT
ACETAB CUP W/GRIPTION 54 (Plate) ×2 IMPLANT
BAG ZIPLOCK 12X15 (MISCELLANEOUS) IMPLANT
BENZOIN TINCTURE PRP APPL 2/3 (GAUZE/BANDAGES/DRESSINGS) IMPLANT
BLADE SAW SGTL 18X1.27X75 (BLADE) ×2 IMPLANT
BLADE SAW SGTL 18X1.27X75MM (BLADE) ×1
CLOSURE WOUND 1/2 X4 (GAUZE/BANDAGES/DRESSINGS)
COVER PERINEAL POST (MISCELLANEOUS) ×3 IMPLANT
COVER SURGICAL LIGHT HANDLE (MISCELLANEOUS) ×3 IMPLANT
COVER WAND RF STERILE (DRAPES) ×3 IMPLANT
CUP ACETAB W/GRIPTION 54 (Plate) ×1 IMPLANT
DRAPE STERI IOBAN 125X83 (DRAPES) ×3 IMPLANT
DRAPE U-SHAPE 47X51 STRL (DRAPES) ×6 IMPLANT
DRSG AQUACEL AG ADV 3.5X10 (GAUZE/BANDAGES/DRESSINGS) ×3 IMPLANT
DURAPREP 26ML APPLICATOR (WOUND CARE) ×3 IMPLANT
ELECT REM PT RETURN 15FT ADLT (MISCELLANEOUS) ×3 IMPLANT
GAUZE XEROFORM 1X8 LF (GAUZE/BANDAGES/DRESSINGS) ×3 IMPLANT
GLOVE BIO SURGEON STRL SZ7.5 (GLOVE) ×3 IMPLANT
GLOVE BIOGEL PI IND STRL 8 (GLOVE) ×2 IMPLANT
GLOVE BIOGEL PI INDICATOR 8 (GLOVE) ×4
GLOVE ECLIPSE 8.0 STRL XLNG CF (GLOVE) ×3 IMPLANT
GOWN STRL REUS W/TWL XL LVL3 (GOWN DISPOSABLE) ×6 IMPLANT
HANDPIECE INTERPULSE COAX TIP (DISPOSABLE) ×3
HEAD CERAMIC 36 PLUS5 (Hips) ×3 IMPLANT
HOLDER FOLEY CATH W/STRAP (MISCELLANEOUS) ×3 IMPLANT
KIT TURNOVER KIT A (KITS) IMPLANT
LINER NEUTRAL 36ID 54OD (Liner) ×3 IMPLANT
PACK ANTERIOR HIP CUSTOM (KITS) ×3 IMPLANT
PENCIL SMOKE EVACUATOR (MISCELLANEOUS) IMPLANT
SET HNDPC FAN SPRY TIP SCT (DISPOSABLE) ×1 IMPLANT
STAPLER VISISTAT 35W (STAPLE) IMPLANT
STEM CORAIL KLA10 (Stem) ×3 IMPLANT
STRIP CLOSURE SKIN 1/2X4 (GAUZE/BANDAGES/DRESSINGS) IMPLANT
SUT ETHIBOND NAB CT1 #1 30IN (SUTURE) ×3 IMPLANT
SUT ETHILON 2 0 PS N (SUTURE) IMPLANT
SUT MNCRL AB 4-0 PS2 18 (SUTURE) IMPLANT
SUT VIC AB 0 CT1 36 (SUTURE) ×3 IMPLANT
SUT VIC AB 1 CT1 36 (SUTURE) ×3 IMPLANT
SUT VIC AB 2-0 CT1 27 (SUTURE) ×6
SUT VIC AB 2-0 CT1 TAPERPNT 27 (SUTURE) ×2 IMPLANT
TRAY FOLEY MTR SLVR 16FR STAT (SET/KITS/TRAYS/PACK) IMPLANT

## 2020-06-29 NOTE — Evaluation (Signed)
Physical Therapy Evaluation Patient Details Name: [name] MRN: [id number] DOB: [birth year] Today's Date: 06/29/2020   History of Present Illness  Patient is 54 y.o. male s/p Rt THA anterior approach on 06/29/20 with PMH significant for DM, OA, anxiety, Lt THA in 2013.    Clinical Impression  [name] is a 54 y.o. male POD 0 s/p Rt THA. Patient reports independence with mobility at baseline. Patient is now limited by functional impairments (see PT problem list below) and requires min assit for transfers and gait with RW. Patient was able to take several small steps to move bed>chair with RW and min assist. Patient instructed in exercise to facilitate circulation. Patient will benefit from continued skilled PT interventions to address impairments and progress towards PLOF. Acute PT will follow to progress mobility and stair training in preparation for safe discharge home.     Follow Up Recommendations Follow surgeon's recommendation for DC plan and follow-up therapies    Equipment Recommendations  None recommended by PT    Recommendations for Other Services       Precautions / Restrictions Precautions Precautions: Fall Restrictions Weight Bearing Restrictions: No RLE Weight Bearing: Weight bearing as tolerated      Mobility  Bed Mobility Overal bed mobility: Needs Assistance Bed Mobility: Supine to Sit     Supine to sit: Min assist;HOB elevated     General bed mobility comments: cues to use bed rail and light assist for Rt LE to mobilize to EOB.    Transfers Overall transfer level: Needs assistance Equipment used: Rolling walker (2 wheeled) Transfers: Sit to/from [employer] Sit to Stand: Min assist Stand pivot transfers: Min assist       General transfer comment: cues for safe hand placement and technique with RW, min assist for power up from EOB. pt steady with rising. pt c/o slight dizziness standing and min assist provided to manage  RW and step to recliner.  Ambulation/Gait                Stairs            Wheelchair Mobility    Modified Rankin (Stroke Patients Only)       Balance Overall balance assessment: Needs assistance Sitting-balance support: Feet supported Sitting balance-Leahy Scale: Good     Standing balance support: During functional activity;Bilateral upper extremity supported Standing balance-Leahy Scale: Fair                               Pertinent Vitals/Pain Pain Assessment: 0-10 Pain Score: 9  Pain Location: Rt hip Pain Descriptors / Indicators: Aching;Discomfort Pain Intervention(s): Limited activity within patient's tolerance;Monitored during session;Repositioned    Home Living Family/patient expects to be discharged to:: Private residence Living Arrangements: Spouse/significant other Available Help at Discharge: Family Type of Home: House Home Access: Stairs to enter Entrance Stairs-Rails: None Entrance Stairs-Number of Steps: 1 Home Layout: Two level;Full bath on main level;Able to live on main level with bedroom/bathroom Home Equipment: Dan Humphreys - 2 wheels;Cane - single point;Shower seat;Toilet riser      Prior Function Level of Independence: Independent               Hand Dominance   Dominant Hand: Right    Extremity/Trunk Assessment   Upper Extremity Assessment Upper Extremity Assessment: Overall WFL for tasks assessed    Lower Extremity Assessment Lower Extremity Assessment: Overall WFL for tasks assessed    Cervical / Trunk  Assessment Cervical / Trunk Assessment: Normal  Communication   Communication: No difficulties  Cognition Arousal/Alertness: Awake/alert Behavior During Therapy: WFL for tasks assessed/performed Overall Cognitive Status: Within Functional Limits for tasks assessed                                        General Comments      Exercises Total Joint Exercises Ankle Circles/Pumps:  AROM;Both;20 reps;Seated   Assessment/Plan    PT Assessment Patient needs continued PT services  PT Problem List Decreased strength;Decreased range of motion;Decreased activity tolerance;Decreased balance;Decreased mobility;Decreased knowledge of use of DME;Pain;Decreased knowledge of precautions       PT Treatment Interventions DME instruction;Gait training;Stair training;Functional mobility training;Therapeutic activities;Therapeutic exercise;Balance training;Patient/family education    PT Goals (Current goals can be found in the Care Plan section)  Acute Rehab PT Goals Patient Stated Goal: recover independence PT Goal Formulation: With patient Time For Goal Achievement: 07/06/20 Potential to Achieve Goals: Good    Frequency 7X/week   Barriers to discharge        Co-evaluation               AM-PAC PT "6 Clicks" Mobility  Outcome Measure Help needed turning from your back to your side while in a flat bed without using bedrails?: None Help needed moving from lying on your back to sitting on the side of a flat bed without using bedrails?: A Little Help needed moving to and from a bed to a chair (including a wheelchair)?: A Little Help needed standing up from a chair using your arms (e.g., wheelchair or bedside chair)?: A Little Help needed to walk in hospital room?: A Little Help needed climbing 3-5 steps with a railing? : A Little 6 Click Score: 19    End of Session Equipment Utilized During Treatment: Gait belt Activity Tolerance: Patient tolerated treatment well Patient left: in chair;with call bell/phone within reach;with family/visitor present Nurse Communication: Mobility status PT Visit Diagnosis: Muscle weakness (generalized) (M62.81);Difficulty in walking, not elsewhere classified (R26.2)    Time: 4967-5916 PT Time Calculation (min) (ACUTE ONLY): 30 min   Charges:   PT Evaluation $PT Eval Low Complexity: 1 Low PT Treatments $Therapeutic Activity: 8-22  mins        Wynn Maudlin, DPT Acute Rehabilitation Services  Office [phone] Pager [phone]  06/29/2020 5:56 PM

## 2020-06-29 NOTE — Transfer of Care (Signed)
Immediate Anesthesia Transfer of Care Note  Patient: [name]  Procedure(s) Performed: RIGHT TOTAL HIP ARTHROPLASTY ANTERIOR APPROACH (Right Hip)  Patient Location: PACU  Anesthesia Type:General  Level of Consciousness: awake, alert , oriented and patient cooperative  Airway & Oxygen Therapy: Patient Spontanous Breathing and Patient connected to face mask oxygen  Post-op Assessment: Report given to RN, Post -op Vital signs reviewed and stable and Patient moving all extremities X 4  Post vital signs: stable  Last Vitals:  Vitals Value Taken Time  BP 129/67 06/29/20 1447  Temp    Pulse 76 06/29/20 1451  Resp 12 06/29/20 1451  SpO2 100 % 06/29/20 1451  Vitals shown include unvalidated device data.  Last Pain:  Vitals:   06/29/20 1025  TempSrc:   PainSc: 1          Complications: No complications documented.

## 2020-06-29 NOTE — Anesthesia Procedure Notes (Signed)
Spinal  Patient location during procedure: OR End time: 06/29/2020 1:00 PM Staffing Performed: resident/CRNA  Resident/CRNA: Lissa Morales, CRNA Preanesthetic Checklist Completed: patient identified, IV checked, site marked, risks and benefits discussed, surgical consent, monitors and equipment checked, pre-op evaluation and timeout performed Spinal Block Patient position: sitting Prep: DuraPrep Patient monitoring: heart rate, continuous pulse ox and blood pressure Approach: midline Location: L4-5 Injection technique: single-shot Needle Needle type: Spinocan  Needle gauge: 24 G Needle length: 9 cm Additional Notes Expiration date of kit checked and confirmed. Patient tolerated procedure well, without complications.

## 2020-06-29 NOTE — Anesthesia Preprocedure Evaluation (Addendum)
Anesthesia Evaluation  Patient identified by MRN, date of birth, ID band Patient awake    Reviewed: Allergy & Precautions, H&P , NPO status , Patient's Chart, lab work & pertinent test results  History of Anesthesia Complications (+) PONV  Airway Mallampati: II  TM Distance: >3 FB Neck ROM: Full    Dental no notable dental hx. (+) Teeth Intact, Dental Advisory Given   Pulmonary sleep apnea , former smoker,    Pulmonary exam normal breath sounds clear to auscultation       Cardiovascular negative cardio ROS   Rhythm:Regular Rate:Normal     Neuro/Psych Anxiety negative neurological ROS     GI/Hepatic negative GI ROS, Neg liver ROS,   Endo/Other  diabetes, Type 2, Oral Hypoglycemic Agents  Renal/GU negative Renal ROS  negative genitourinary   Musculoskeletal  (+) Arthritis , Osteoarthritis,    Abdominal   Peds  Hematology negative hematology ROS (+)   Anesthesia Other Findings   Reproductive/Obstetrics negative OB ROS                            Anesthesia Physical Anesthesia Plan  ASA: III  Anesthesia Plan: Spinal and MAC   Post-op Pain Management:    Induction: Intravenous  PONV Risk Score and Plan: 3 and Ondansetron, Propofol infusion and Midazolam  Airway Management Planned: Simple Face Mask  Additional Equipment:   Intra-op Plan:   Post-operative Plan:   Informed Consent: I have reviewed the patients History and Physical, chart, labs and discussed the procedure including the risks, benefits and alternatives for the proposed anesthesia with the patient or authorized representative who has indicated his/her understanding and acceptance.     Dental advisory given  Plan Discussed with: CRNA  Anesthesia Plan Comments:         Anesthesia Quick Evaluation

## 2020-06-29 NOTE — Anesthesia Procedure Notes (Signed)
Procedure Name: MAC Date/Time: 06/29/2020 12:50 PM Performed by: Lissa Morales, CRNA Pre-anesthesia Checklist: Patient identified, Emergency Drugs available, Suction available, Patient being monitored and Timeout performed Patient Re-evaluated:Patient Re-evaluated prior to induction Oxygen Delivery Method: Simple face mask Placement Confirmation: positive ETCO2

## 2020-06-29 NOTE — Interval H&P Note (Signed)
History and Physical Interval Note: The patient denies he is here today for a right total hip arthroplasty joint pain from his known hip osteoarthritis on the right side.  There has been no acute change in his medical status.  See recent H&P.  The risk and benefits of surgery have been explained in detail and informed consent is obtained.  The right hip has been marked.  06/29/2020 11:24 AM  [name]  has presented today for surgery, with the diagnosis of osteoarthritis right hip.  The various methods of treatment have been discussed with the patient and family. After consideration of risks, benefits and other options for treatment, the patient has consented to  Procedure(s): RIGHT TOTAL HIP ARTHROPLASTY ANTERIOR APPROACH (Right) as a surgical intervention.  The patient's history has been reviewed, patient examined, no change in status, stable for surgery.  I have reviewed the patient's chart and labs.  Questions were answered to the patient's satisfaction.     Kathryne Hitch

## 2020-06-29 NOTE — Anesthesia Procedure Notes (Addendum)
Procedure Name: LMA Insertion Date/Time: 06/29/2020 1:31 PM Performed by: Illene Silver, CRNA Pre-anesthesia Checklist: Patient identified, Emergency Drugs available, Suction available and Patient being monitored Patient Re-evaluated:Patient Re-evaluated prior to induction Oxygen Delivery Method: Circle system utilized Preoxygenation: Pre-oxygenation with 100% oxygen Induction Type: IV induction Ventilation: Mask ventilation without difficulty LMA: LMA with gastric port inserted LMA Size: 5.0 Tube type: Oral Number of attempts: 1 Airway Equipment and Method: Oral airway Placement Confirmation: positive ETCO2 Tube secured with: Tape Dental Injury: Teeth and Oropharynx as per pre-operative assessment

## 2020-06-29 NOTE — Brief Op Note (Signed)
06/29/2020  2:31 PM  PATIENT:  [name]  54 y.o. male  PRE-OPERATIVE DIAGNOSIS:  osteoarthritis right hip  POST-OPERATIVE DIAGNOSIS:  osteoarthritis right hip  PROCEDURE:  Procedure(s): RIGHT TOTAL HIP ARTHROPLASTY ANTERIOR APPROACH (Right)  SURGEON:  Surgeon(s) and Role:    Kathryne Hitch, MD - Primary  PHYSICIAN ASSISTANT:  Rexene Edison, PA-C  ANESTHESIA:   spinal and general  EBL:  200 mL   COUNTS:  YES  DICTATION: .Other Dictation: Dictation Number [id number]  PLAN OF CARE: Admit for overnight observation  PATIENT DISPOSITION:  PACU - hemodynamically stable.   Delay start of Pharmacological VTE agent (>24hrs) due to surgical blood loss or risk of bleeding: no

## 2020-06-29 NOTE — Anesthesia Postprocedure Evaluation (Signed)
Anesthesia Post Note  Patient: [name]  Procedure(s) Performed: RIGHT TOTAL HIP ARTHROPLASTY ANTERIOR APPROACH (Right Hip)     Patient location during evaluation: PACU Anesthesia Type: General Level of consciousness: awake and alert Pain management: pain level controlled Vital Signs Assessment: post-procedure vital signs reviewed and stable Respiratory status: spontaneous breathing, nonlabored ventilation, respiratory function stable and patient connected to nasal cannula oxygen Cardiovascular status: blood pressure returned to baseline and stable Postop Assessment: no apparent nausea or vomiting Anesthetic complications: no   No complications documented.  Last Vitals:  Vitals:   06/29/20 1600 06/29/20 1628  BP: 134/87 (!) 147/79  Pulse: 70 77  Resp: 12 17  Temp: 37 C 37.1 C  SpO2: 100% 100%    Last Pain:  Vitals:   06/29/20 1628  TempSrc: Oral  PainSc:                  [name],W. EDMOND

## 2020-06-30 DIAGNOSIS — M1611 Unilateral primary osteoarthritis, right hip: Secondary | ICD-10-CM | POA: Diagnosis not present

## 2020-06-30 LAB — CBC
HCT: 36.2 % — ABNORMAL LOW (ref 39.0–52.0)
Hemoglobin: 12.3 g/dL — ABNORMAL LOW (ref 13.0–17.0)
MCH: 30.8 pg (ref 26.0–34.0)
MCHC: 34 g/dL (ref 30.0–36.0)
MCV: 90.7 fL (ref 80.0–100.0)
Platelets: 145 10*3/uL — ABNORMAL LOW (ref 150–400)
RBC: 3.99 MIL/uL — ABNORMAL LOW (ref 4.22–5.81)
RDW: 12.4 % (ref 11.5–15.5)
WBC: 11.4 10*3/uL — ABNORMAL HIGH (ref 4.0–10.5)
nRBC: 0 % (ref 0.0–0.2)

## 2020-06-30 LAB — BASIC METABOLIC PANEL
Anion gap: 9 (ref 5–15)
BUN: 19 mg/dL (ref 6–20)
CO2: 23 mmol/L (ref 22–32)
Calcium: 8.6 mg/dL — ABNORMAL LOW (ref 8.9–10.3)
Chloride: 101 mmol/L (ref 98–111)
Creatinine, Ser: 0.78 mg/dL (ref 0.61–1.24)
GFR, Estimated: >60 mL/min (ref >60)
Glucose, Bld: 191 mg/dL — ABNORMAL HIGH (ref 70–99)
Potassium: 4.2 mmol/L (ref 3.5–5.1)
Sodium: 133 mmol/L — ABNORMAL LOW (ref 135–145)

## 2020-06-30 LAB — GLUCOSE, CAPILLARY
Glucose-Capillary: 182 mg/dL — ABNORMAL HIGH (ref 70–99)
Glucose-Capillary: 166 mg/dL — ABNORMAL HIGH (ref 70–99)

## 2020-06-30 MED ORDER — METHOCARBAMOL 500 MG PO TABS: 500.0000 mg | ORAL_TABLET | Freq: Four times a day (QID) | ORAL | 1 refills | Status: DC | PRN

## 2020-06-30 MED ORDER — ASPIRIN 81 MG PO CHEW: 81.0000 mg | CHEWABLE_TABLET | Freq: Two times a day (BID) | ORAL | 0 refills | Status: DC

## 2020-06-30 MED ORDER — HYDROCODONE-ACETAMINOPHEN 5-325 MG PO TABS: 1.0000 | ORAL_TABLET | Freq: Four times a day (QID) | ORAL | 0 refills | Status: DC | PRN

## 2020-06-30 NOTE — Progress Notes (Signed)
Physical Therapy Treatment Patient Details Name: [name] MRN: [id number] DOB: [birth year] Today's Date: 06/30/2020    History of Present Illness Patient is 54 y.o. male s/p Rt THA anterior approach on 06/29/20 with PMH significant for DM, OA, anxiety, Lt THA in 2013.    PT Comments    Pt progressing well with mobility and with no c/o dizziness this am.   Follow Up Recommendations  Follow surgeon's recommendation for DC plan and follow-up therapies     Equipment Recommendations  None recommended by PT    Recommendations for Other Services       Precautions / Restrictions Precautions Precautions: Fall Restrictions Weight Bearing Restrictions: No RLE Weight Bearing: Weight bearing as tolerated    Mobility  Bed Mobility Overal bed mobility: Needs Assistance Bed Mobility: Supine to Sit     Supine to sit: Min guard     General bed mobility comments: use of bed rail to self assist  Transfers Overall transfer level: Needs assistance Equipment used: Rolling walker (2 wheeled) Transfers: Sit to/from [employer] Sit to Stand: Min guard Stand pivot transfers: Min guard       General transfer comment: cues for LE management and use of UEs to self assist  Ambulation/Gait Ambulation/Gait assistance: Min guard;Supervision Gait Distance (Feet): 160 Feet Assistive device: Rolling walker (2 wheeled) Gait Pattern/deviations: Decreased step length - right;Decreased step length - left;Shuffle;Step-to pattern;Step-through pattern Gait velocity: decr   General Gait Details: cues for posture, position from RW and initial sequence   Stairs             Wheelchair Mobility    Modified Rankin (Stroke Patients Only)       Balance Overall balance assessment: Needs assistance Sitting-balance support: Feet supported Sitting balance-Leahy Scale: Good     Standing balance support: During functional activity;Bilateral upper extremity  supported Standing balance-Leahy Scale: Fair                              Cognition Arousal/Alertness: Awake/alert Behavior During Therapy: WFL for tasks assessed/performed Overall Cognitive Status: Within Functional Limits for tasks assessed                                        Exercises Total Joint Exercises Ankle Circles/Pumps: AROM;Both;20 reps;Supine Quad Sets: AROM;Both;10 reps;Supine Heel Slides: AAROM;20 reps;Supine;Right Hip ABduction/ADduction: AAROM;Right;15 reps;Supine    General Comments        Pertinent Vitals/Pain Pain Assessment: 0-10 Pain Score: 5  Pain Location: Rt hip Pain Descriptors / Indicators: Aching;Discomfort Pain Intervention(s): Limited activity within patient's tolerance;Monitored during session;Premedicated before session;Ice applied    Home Living                      Prior Function            PT Goals (current goals can now be found in the care plan section) Acute Rehab PT Goals Patient Stated Goal: recover independence PT Goal Formulation: With patient Time For Goal Achievement: 07/06/20 Potential to Achieve Goals: Good Progress towards PT goals: Progressing toward goals    Frequency    7X/week      PT Plan Current plan remains appropriate    Co-evaluation              AM-PAC PT "6 Clicks" Mobility   Outcome Measure  Help needed turning from your back to your side while in a flat bed without using bedrails?: None Help needed moving from lying on your back to sitting on the side of a flat bed without using bedrails?: A Little Help needed moving to and from a bed to a chair (including a wheelchair)?: A Little Help needed standing up from a chair using your arms (e.g., wheelchair or bedside chair)?: A Little Help needed to walk in hospital room?: A Little Help needed climbing 3-5 steps with a railing? : A Little 6 Click Score: 19    End of Session Equipment Utilized During  Treatment: Gait belt Activity Tolerance: Patient tolerated treatment well Patient left: in chair;with call bell/phone within reach;with family/visitor present Nurse Communication: Mobility status PT Visit Diagnosis: Muscle weakness (generalized) (M62.81);Difficulty in walking, not elsewhere classified (R26.2)     Time: 8325-4982 PT Time Calculation (min) (ACUTE ONLY): 28 min  Charges:  $Gait Training: 8-22 mins $Therapeutic Exercise: 8-22 mins                     Mauro Kaufmann PT Acute Rehabilitation Services Pager [phone] Office [phone]    Jarelis Ehlert 06/30/2020, 1:04 PM

## 2020-06-30 NOTE — Discharge Summary (Signed)
Patient ID: [name] MRN: [id number] DOB/AGE: [birth year] 54 y.o.  Admit date: 06/29/2020 Discharge date: 06/30/2020  Admission Diagnoses:  Principal Problem:   Unilateral primary osteoarthritis, right hip Active Problems:   Status post total replacement of right hip   Status post total hip replacement, right   Discharge Diagnoses:  Same  Past Medical History:  Diagnosis Date  . Anxiety   . Arthritis    bilateral hips  . Diabetes mellitus without complication (HCC)    type 2   . PONV (postoperative nausea and vomiting)   . Sleep apnea   . Strep throat 09/17/11   on Zpac    Surgeries: Procedure(s): RIGHT TOTAL HIP ARTHROPLASTY ANTERIOR APPROACH on 06/29/2020   Consultants:   Discharged Condition: Improved  Hospital Course: [name] is an 54 y.o. male who was admitted 06/29/2020 for operative treatment ofUnilateral primary osteoarthritis, right hip. Patient has severe unremitting pain that affects sleep, daily activities, and work/hobbies. After pre-op clearance the patient was taken to the operating room on 06/29/2020 and underwent  Procedure(s): RIGHT TOTAL HIP ARTHROPLASTY ANTERIOR APPROACH.    Patient was given perioperative antibiotics:  Anti-infectives (From admission, onward)   Start     Dose/Rate Route Frequency Ordered Stop   06/29/20 1900  ceFAZolin (ANCEF) IVPB 1 g/50 mL premix        1 g 100 mL/hr over 30 Minutes Intravenous Every 6 hours 06/29/20 1623 06/30/20 0056   06/29/20 1030  ceFAZolin (ANCEF) IVPB 2g/100 mL premix        2 g 200 mL/hr over 30 Minutes Intravenous On call to O.R. 06/29/20 1022 06/29/20 1255       Patient was given sequential compression devices, early ambulation, and chemoprophylaxis to prevent DVT.  Patient benefited maximally from hospital stay and there were no complications.    Recent vital signs:  Patient Vitals for the past 24 hrs:  BP Temp Temp src Pulse Resp SpO2  06/30/20 0947 126/70 98.1 F (36.7 C)  Oral 79 17 97 %  06/30/20 0618 127/67 98.5 F (36.9 C) Oral 84 16 --  06/30/20 0241 125/65 97.9 F (36.6 C) Oral 86 16 98 %  06/29/20 1939 139/67 98 F (36.7 C) Oral 87 16 97 %  06/29/20 1842 127/70 97.6 F (36.4 C) Oral 76 16 97 %  06/29/20 1726 114/84 -- -- 79 16 98 %  06/29/20 1628 (!) 147/79 98.7 F (37.1 C) Oral 77 17 100 %  06/29/20 1600 134/87 98.6 F (37 C) -- 70 12 100 %  06/29/20 1545 (!) 127/107 -- -- 73 (!) 9 100 %  06/29/20 1530 132/75 -- -- 66 12 100 %  06/29/20 1515 136/80 -- -- 72 11 100 %  06/29/20 1500 135/72 -- -- 73 13 100 %  06/29/20 1447 129/67 98.5 F (36.9 C) -- 78 13 100 %     Recent laboratory studies:  Recent Labs    06/30/20 0328  WBC 11.4*  HGB 12.3*  HCT 36.2*  PLT 145*  NA 133*  K 4.2  CL 101  CO2 23  BUN 19  CREATININE 0.78  GLUCOSE 191*  CALCIUM 8.6*     Discharge Medications:   Allergies as of 06/30/2020   No Known Allergies     Medication List    TAKE these medications   aspirin 81 MG chewable tablet Chew 1 tablet (81 mg total) by mouth 2 (two) times daily.   atorvastatin 40 MG tablet Commonly known as: LIPITOR Take  40 mg by mouth daily before breakfast.   cetirizine 10 MG tablet Commonly known as: ZYRTEC Take 10 mg by mouth daily.   HYDROcodone-acetaminophen 5-325 MG tablet Commonly known as: NORCO/VICODIN Take 1-2 tablets by mouth every 6 (six) hours as needed for moderate pain.   metFORMIN 1000 MG tablet Commonly known as: GLUCOPHAGE Take 1,000 mg by mouth in the morning and at bedtime.   methocarbamol 500 MG tablet Commonly known as: ROBAXIN Take 1 tablet (500 mg total) by mouth every 6 (six) hours as needed for muscle spasms.   naproxen sodium 220 MG tablet Commonly known as: ALEVE Take 220 mg by mouth 2 (two) times daily with a meal.   timolol 0.5 % ophthalmic solution Commonly known as: TIMOPTIC Place 1 drop into both eyes 2 (two) times daily.            Durable Medical Equipment  (From  admission, onward)         Start     Ordered   06/29/20 1624  DME 3 n 1  Once        06/29/20 1623   06/29/20 1624  DME Walker rolling  Once       Question Answer Comment  Walker: With 5 Inch Wheels   Patient needs a walker to treat with the following condition Status post total replacement of right hip      06/29/20 1623          Diagnostic Studies: DG Pelvis Portable  Result Date: 06/29/2020 CLINICAL DATA:  Status post right hip replacement. EXAM: PORTABLE PELVIS 1-2 VIEWS COMPARISON:  June 29, 2020 FINDINGS: Bilateral total hip replacements are seen. There is no evidence of surrounding lucency to suggest the presence of hardware loosening or infection. There is no evidence of an acute pelvic fracture or diastasis. No pelvic bone lesions are seen. A mild-to-moderate amount of soft tissue air is seen adjacent to the medial and lateral aspect of the proximal right femur. Radiopaque surgical staples are seen along the lateral aspect of the right hip. IMPRESSION: Bilateral total hip replacements without evidence of hardware complication or diastasis. Electronically Signed   By: Aram Candela M.D.   On: 06/29/2020 15:38   DG C-Arm 1-60 Min-No Report  Result Date: 06/29/2020 Fluoroscopy was utilized by the requesting physician.  No radiographic interpretation.   DG HIP OPERATIVE UNILAT W OR W/O PELVIS RIGHT  Result Date: 06/29/2020 CLINICAL DATA:  Total right hip arthroplasty. EXAM: OPERATIVE RIGHT HIP (WITH PELVIS IF PERFORMED) 3 VIEWS TECHNIQUE: Fluoroscopic spot image(s) were submitted for interpretation post-operatively. COMPARISON:  04/25/2020. FINDINGS: Total right hip replacement. Hardware intact. Anatomic alignment. No acute bony abnormality. IMPRESSION: Total right hip replacement with anatomic alignment. Electronically Signed   By: Maisie Fus  Register   On: 06/29/2020 14:57    Disposition: Discharge disposition: 01-Home or Self Care          Follow-up Information     Kathryne Hitch, MD Follow up in 2 week(s).   Specialty: Orthopedic Surgery Contact information: [address] [place] [address] [phone]        [facility] Follow up.   Specialty: Home Health Services Why: agency will provide physical therapy Contact information: [address] [place] [address] [phone]                Signed: Kathryne Hitch 06/30/2020, 10:58 AM

## 2020-06-30 NOTE — Op Note (Signed)
Derek, Young MEDICAL RECORD WV:[id number] ACCOUNT [id number] DATE OF BIRTH:1966/03/06 FACILITY: [facility] LOCATION: [facility] PHYSICIAN:Mattie Nordell Aretha Parrot, MD  OPERATIVE REPORT  DATE OF PROCEDURE:  06/29/2020  PREOPERATIVE DIAGNOSIS:  Primary osteoarthritis and degenerative joint disease, right hip.  POSTOPERATIVE DIAGNOSIS:  Primary osteoarthritis and degenerative joint disease, right hip.  PROCEDURE:  Right total hip arthroplasty through direct anterior approach.  IMPLANTS:  DePuy Sector Gription acetabular component size 54, size 36+0 neutral polyethylene liner, size 10 Corail femoral component with varus offset, size 36+5 ceramic hip ball.  SURGEON:  Vanita Panda. Magnus Ivan, MD  ASSISTANT:  Richardean Canal, PA-C.  ANESTHESIA: 1.  Attempted spinal. 2.  General.  ESTIMATED BLOOD LOSS:  200 mL.  ANTIBIOTICS:  Two grams IV Ancef.  COMPLICATIONS:  None.  INDICATIONS:  The patient is a 54 year old gentleman well known to me.  I actually replaced his left hip eight and a half years ago in 2013.  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  At this point, he  has developed into significant osteoarthritis.  His right hip pain is daily and it is detrimentally affecting his mobility, his quality of life and his activities of daily living.  He has lost weight and he is a diabetic,  under good control.  At this  point, with his hip pain bothering him as much significantly and his x-rays showing worsening arthritis of the hip, he does wish to proceed with total hip arthroplasty on the right side.  We had a long and thorough discussion about the risk of acute  blood loss anemia, nerve or vessel injury, fracture, infection, dislocation, DVT and implant failure and skin and soft tissue healing issues.  We talked about the goals being decreased pain, improve mobility and overall improved quality of life.  DESCRIPTION OF PROCEDURE:  After  informed consent was obtained and appropriate right hip was marked, he was brought to the operating room, sat up on a stretcher where spinal anesthesia was then obtained.  A Foley catheter was placed, I assessed his leg  lengths and placed traction boots on both his feet.  Next, he was placed supine on the Hana fracture table, the perineal post in place and both legs in line skeletal traction device and no traction applied.  His right operative hip was prepped and draped  with DuraPrep and sterile drapes.  A time-out was called.  He was identified as correct patient, correct right hip.  I then tested his skin with forceps and his spinal anesthesia had not set up.  They decided to go with general anesthesia via an LMA.  I  then was able to make an incision just inferior and posterior to the anterior superior iliac spine and carried this slightly obliquely down the leg.  I dissected down tensor fascia lata muscle.  Tensor fascia was then divided longitudinally to proceed  with a direct anterior approach to the hip.  We identified and cauterized circumflex vessels and identified the hip capsule, entered the hip capsule in an L-type format, finding moderate joint effusion and significant periarticular osteophytes around the  lateral femoral head, neck and definitely findings consistent with previous femoral acetabular impingement.  I placed Cobra retractors within the medial and lateral femoral neck and made our femoral neck cut with an oscillating saw just proximal to the  lesser trochanter and I completed this with an osteotome.  I placed a corkscrew guide in the femoral head and removed the  femoral head in its entirety and there was a wide area laterally that was devoid of cartilage.  I then placed a bent Hohmann over  the medial acetabular rim and removed remnants of acetabular labrum and other debris.  I then began reaming under direct visualization from a size 43 reamer in stepwise increments going up to a  size 53 with all reamers placed under direct visualization.   The last reamer also placed under direct fluoroscopy so I could obtain my depth of reaming, my inclination and anteversion.  I then selected a DePuy Sector Gription acetabular component size 54 and placed this without difficulty.  I went with a 36+0  neutral liner based off his offset and the seating of the cup.  Attention was then turned to the femur.  With the leg externally rotated to 120 degrees, extended and adducted, I was able to place a Mueller retractor medially and Hohmann retractor behind  the greater trochanter, released lateral joint capsule and used a box-cutting osteotome to enter the femoral canal and a rongeur to lateralize, then began broaching using the Corail broaching system from a size 8 going to just the size 10.  I had a very  tight canal.  Based on his previous anatomy and x-rays  on the other side from previous implants, we chose a varus offset femoral neck and with a 36+1.5 hip ball, reduced this in the acetabulum, and it felt stable, but I felt like we would not just  benefit from just a little bit more leg length.  We dislocated the hip and removed the trial components.  I placed the real Corail femoral component with varus offset size 10 and went with a 36+5 ceramic hip ball and reduced this in the acetabulum and I  felt we had achieved his leg length in offset, maybe like just a touch longer, but he felt stable on my exam, assessed radiographically and clinically.  We then irrigated the soft tissue with normal saline solution using pulsatile lavage.  We dried the  hip real well and then closed the arthrotomy with interrupted #1 Vicryl to close the tensor fascia, 0 Vicryl was used to close the deep tissue, 2-0 Vicryl was used to close the subcutaneous tissue.  The skin was reapproximated with staples.  An Aquacel  dressing was applied.  He was taken off the Hana table, awakened, extubated, and taken to recovery room  in stable condition with all final counts being correct.  There were no complications noted.  Of note, Rexene Edison, PA-C did assist during the entire  case and assistance was crucial for facilitating all aspects of this case.  IN/NUANCE  D:06/29/2020 T:06/30/2020 JOB:[id number]

## 2020-06-30 NOTE — Progress Notes (Signed)
°  Subjective: Patient appears exceedingly comfortable.  Has not yet been up with therapy   Objective: Vital signs in last 24 hours: Temp:  [97.6 F (36.4 C)-98.7 F (37.1 C)] 98.5 F (36.9 C) (12/11 0618) Pulse Rate:  [66-87] 84 (12/11 0618) Resp:  [9-17] 16 (12/11 0618) BP: (114-153)/(65-107) 127/67 (12/11 0618) SpO2:  [97 %-100 %] 98 % (12/11 0241) Weight:  [94.1 kg] 94.1 kg (12/10 1025)  Intake/Output from previous day: 12/10 0701 - 12/11 0700 In: 4116.9 [P.O.:960; I.V.:2906.9; IV Piggyback:250] Out: 1325 [Urine:1125; Blood:200] Intake/Output this shift: No intake/output data recorded.  Exam:  Neurovascular intact Sensation intact distally Intact pulses distally Dorsiflexion/Plantar flexion intact  Labs: Recent Labs    06/30/20 0328  HGB 12.3*   Recent Labs    06/30/20 0328  WBC 11.4*  RBC 3.99*  HCT 36.2*  PLT 145*   Recent Labs    06/30/20 0328  NA 133*  K 4.2  CL 101  CO2 23  BUN 19  CREATININE 0.78  GLUCOSE 191*  CALCIUM 8.6*   No results for input(s): LABPT, INR in the last 72 hours.  Assessment/Plan: Plan at this time is therapy evaluation and likely discharge home later this afternoon.   Marrianne Mood Satoria Dunlop 06/30/2020, 9:20 AM

## 2020-06-30 NOTE — Plan of Care (Signed)
Pt ready for DC after second session of PT. Home with wife.

## 2020-06-30 NOTE — Discharge Instructions (Signed)

## 2020-06-30 NOTE — TOC Progression Note (Signed)
Transition of Care [facility]) - Progression Note    Patient Details  Name: [name] MRN: [id number] Date of Birth: May 04, 1966  Transition of [facility]) CM/SW Contact  Armanda Heritage, RN Phone Number: 06/30/2020, 10:24 AM  Clinical Narrative:    CM spoke with patient who reports he has rolling walker and declines 3in1.  [facility] to provide HHPT.   Expected Discharge Plan: Home w Home Health Services Barriers to Discharge: Continued Medical Work up  Expected Discharge Plan and Services Expected Discharge Plan: Home w Home Health Services   Discharge Planning Services: CM Consult Post Acute Care Choice: Home Health Living arrangements for the past 2 months: Single Family Home                 DME Arranged: N/A DME Agency: NA       HH Arranged: PT HH Agency: [facility]     Representative spoke with at [facility] Agency: pre-arranged in MD office   Social Determinants of Health (SDOH) Interventions    Readmission Risk Interventions No flowsheet data found.

## 2020-07-02 ENCOUNTER — Encounter (HOSPITAL_COMMUNITY): Payer: Self-pay | Admitting: Orthopaedic Surgery

## 2020-07-03 ENCOUNTER — Telehealth: Payer: Self-pay

## 2020-07-03 NOTE — Telephone Encounter (Signed)
[name] called she is requesting verbal orders for home health pt  2w3,3w2 [phone]

## 2020-07-03 NOTE — Telephone Encounter (Signed)
Verbal order left on VM  

## 2020-07-04 ENCOUNTER — Telehealth: Payer: Self-pay | Admitting: Orthopaedic Surgery

## 2020-07-04 ENCOUNTER — Telehealth: Payer: Self-pay

## 2020-07-04 MED ORDER — HYDROCODONE-ACETAMINOPHEN 5-325 MG PO TABS: 1.0000 | ORAL_TABLET | Freq: Four times a day (QID) | ORAL | 0 refills | Status: AC | PRN

## 2020-07-04 NOTE — Telephone Encounter (Signed)
Patient's wife [name] would like to know what they need to use to clean the incision area when changing the dressing on Friday, 07/06/2020, if needed? Cb# [phone].  Please advise.  Thank you.

## 2020-07-04 NOTE — Telephone Encounter (Signed)
Called and advised.

## 2020-07-04 NOTE — Telephone Encounter (Signed)
Just and alcohol swab will do

## 2020-07-04 NOTE — Telephone Encounter (Signed)
Patient's wife Amie called requesting a refill of pt hydrocodone. Please send to pharmacy on file. Patient's wife phone number is [phone].

## 2020-07-05 ENCOUNTER — Telehealth: Payer: Self-pay | Admitting: Orthopaedic Surgery

## 2020-07-05 NOTE — Telephone Encounter (Signed)
Patients wife aware this was sent in for him yesterday

## 2020-07-05 NOTE — Telephone Encounter (Signed)
Patient's wife Amie called about an update for patient's refill of hydrocodone. Please call patient when medication has been sent in. Patient's phone number is [phone].

## 2020-07-12 ENCOUNTER — Encounter: Payer: Self-pay | Admitting: Orthopaedic Surgery

## 2020-07-12 ENCOUNTER — Ambulatory Visit (INDEPENDENT_AMBULATORY_CARE_PROVIDER_SITE_OTHER): Payer: 59 | Admitting: Orthopaedic Surgery

## 2020-07-12 DIAGNOSIS — Z96641 Presence of right artificial hip joint: Secondary | ICD-10-CM

## 2020-07-12 NOTE — Progress Notes (Signed)
The patient will be 2 weeks tomorrow status post a right total hip arthroplasty.  He does have significant bruising all around his hip and even past his knee.  There is no calf pain and his calf is soft on the right side.  He has been wearing compressive garments and has been on 81 mg of aspirin twice daily.  He is still having some shooting and stabbing pains around his hip.  He does feel that the surgery was easier than his other hip that was done about 8 years ago.  He has been on his pain medication as well as muscle relaxants and Neurontin.  I did remove the staples in place Steri-Strips.  There is no significant seroma.  There is significant bruising.  I would like him to go down to 1 aspirin a day for the next week and then can stop his aspirin.  He can start taking anti-inflammatories on a regular basis as needed.  We had a long and thorough discussion about his hip and restrictions and getting back to driving.  I gave him a note to return to work starting January 17.  All questions and concerns were answered and addressed.  We will see him back in 4 weeks to see how is doing overall but no x-rays are needed.

## 2020-07-17 ENCOUNTER — Telehealth: Payer: Self-pay | Admitting: Orthopaedic Surgery

## 2020-07-17 NOTE — Telephone Encounter (Signed)
[employer] forms received. Sent to [facility]. 

## 2020-07-23 ENCOUNTER — Telehealth: Payer: Self-pay | Admitting: Orthopaedic Surgery

## 2020-07-23 NOTE — Telephone Encounter (Signed)
Asking for verberal orders to discharge from therapy. Call her at [phone], you can leave message

## 2020-07-23 NOTE — Telephone Encounter (Signed)
Verbal orders given  

## 2020-08-09 ENCOUNTER — Ambulatory Visit (INDEPENDENT_AMBULATORY_CARE_PROVIDER_SITE_OTHER): Payer: 59 | Admitting: Orthopaedic Surgery

## 2020-08-09 ENCOUNTER — Encounter: Payer: Self-pay | Admitting: Orthopaedic Surgery

## 2020-08-09 DIAGNOSIS — Z96641 Presence of right artificial hip joint: Secondary | ICD-10-CM

## 2020-08-09 NOTE — Progress Notes (Signed)
[name] comes in today about 6 weeks status post a right total hip arthroplasty.  We replaced his left hip many years ago.  He says he is doing well and just has some achiness at night and when he drives.  Otherwise he feels like he is doing well.  He is scheduled to have a dental cleaning next month.  I told him to push this out till April.  He does not need antibiotics for dental clearance from my standpoint.  He is walking with a more balanced gait.  He looks good overall.  All question concerns were answered and addressed.  I do not need to really see him back for 6 months unless he is having issues.  At that visit I like a standing low AP pelvis and lateral of his more recent right operative hip.

## 2020-12-25 ENCOUNTER — Encounter: Payer: Self-pay | Admitting: Orthopaedic Surgery

## 2020-12-27 ENCOUNTER — Encounter: Payer: Self-pay | Admitting: Orthopaedic Surgery

## 2020-12-27 ENCOUNTER — Other Ambulatory Visit: Payer: Self-pay

## 2020-12-27 ENCOUNTER — Ambulatory Visit: Payer: Self-pay

## 2020-12-27 ENCOUNTER — Ambulatory Visit (INDEPENDENT_AMBULATORY_CARE_PROVIDER_SITE_OTHER): Payer: 59 | Admitting: Orthopaedic Surgery

## 2020-12-27 DIAGNOSIS — Z96641 Presence of right artificial hip joint: Secondary | ICD-10-CM | POA: Diagnosis not present

## 2020-12-27 DIAGNOSIS — M7661 Achilles tendinitis, right leg: Secondary | ICD-10-CM

## 2020-12-27 MED ORDER — MELOXICAM 15 MG PO TABS: 15.0000 mg | ORAL_TABLET | Freq: Every day | ORAL | 3 refills | Status: DC

## 2020-12-27 NOTE — Progress Notes (Signed)
Office Visit Note   Patient: [name]           Date of Birth: [birth year]           MRN: [id number] Visit Date: 12/27/2020              Requested by: Verl Bangs, MD [address] [place],  [place] [address] PCP: Verl Bangs, MD   Assessment & Plan: Visit Diagnoses:  1. Status post total hip replacement, right   2. Achilles tendinitis, right leg     Plan: I talked her about trying Voltaren gel over both the trochanteric area and the IT band as well as the Achilles.  I cut out a Thera-Band for him and showed him how to stretch for the Achilles tendon on the right side and I showed him stretching exercises for his right hip area.  I did refill his meloxicam.  My neck step would be formal physical therapy if this does not get better.  I would like to see him back for repeat evaluation and 4 weeks no x-rays are needed.  Follow-Up Instructions: Return in about 4 weeks (around 01/24/2021).   Orders:  Orders Placed This Encounter  Procedures   XR HIP UNILAT W OR W/O PELVIS 2-3 VIEWS RIGHT   Meds ordered this encounter  Medications   meloxicam (MOBIC) 15 MG tablet    Sig: Take 1 tablet (15 mg total) by mouth daily.    Dispense:  30 tablet    Refill:  3      Procedures: No procedures performed   Clinical Data: No additional findings.   Subjective: Chief Complaint  Patient presents with   Right Hip - Routine Post Op, Pain  The patient is now 6 months status post a right total hip arthroplasty.  His right hip has been hurting but has been of the trochanteric area and IT band.  He says it hurts to sit or lay on that side.  Denies any groin pain.  He does report some itching of his incision no swelling.  He is also been dealing with right ankle Achilles tendinitis and has had a shoe with an insert and other things to try and this may have thrown off his gait and is created this bursitis around the hip itself.  He is taken some naproxen for pain.  He has  had meloxicam in the past and is requesting a refill of this which I think is also reasonable.  He denies any systemic illnesses.  HPI  Review of Systems He currently denies any fever, chills, nausea, vomiting  Objective: Vital Signs: There were no vitals taken for this visit.  Physical Exam He is alert and orient x3 and in no acute distress Ortho Exam Examination of his more recent right operative hip shows a well-healed surgical incision.  His pain is definitely over the trochanteric area and IT band.  His hip moves smoothly and fluidly and there is no pain in the groin at all.  He does have significant Achilles pain on the right side with a Thompson test is negative but definitely painful to palpation swelling around the Achilles tendon on the right side. Specialty Comments:  No specialty comments available.  Imaging: XR HIP UNILAT W OR W/O PELVIS 2-3 VIEWS RIGHT  Result Date: 12/27/2020 An AP pelvis and lateral of the right hip shows bilateral total hip arthroplasties with no complicating features.  More recent right hip appears to be well-seated.    PMFS History:  Patient Active Problem List   Diagnosis Date Noted   Status post total replacement of right hip 06/29/2020   Status post total hip replacement, right 06/29/2020   Unilateral primary osteoarthritis, right hip 06/28/2020   Snoring 11/01/2015   Hypersomnia 10/31/2015   Degenerative arthritis of hip 09/19/2011   Past Medical History:  Diagnosis Date   Anxiety    Arthritis    bilateral hips   Diabetes mellitus without complication (HCC)    type 2    PONV (postoperative nausea and vomiting)    Sleep apnea    Strep throat 09/17/11   on Zpac    History reviewed. No pertinent family history.  Past Surgical History:  Procedure Laterality Date   HERNIA REPAIR     left inguinal    NECK SURGERY     TOTAL HIP ARTHROPLASTY  09/19/2011   Procedure: TOTAL HIP ARTHROPLASTY ANTERIOR APPROACH;  Surgeon: Kathryne Hitch, MD;  Location: [facility];  Service: Orthopedics;  Laterality: Left;   TOTAL HIP ARTHROPLASTY Right 06/29/2020   Procedure: RIGHT TOTAL HIP ARTHROPLASTY ANTERIOR APPROACH;  Surgeon: Kathryne Hitch, MD;  Location: [facility];  Service: Orthopedics;  Laterality: Right;   WISDOM TOOTH EXTRACTION     Social History   Occupational History   Not on file  Tobacco Use   Smoking status: Former    Years: 20.00    Pack years: 0.00    Types: Cigarettes    Quit date: 09/03/2011    Years since quitting: 9.3   Smokeless tobacco: Never  Vaping Use   Vaping Use: Never used  Substance and Sexual Activity   Alcohol use: Yes    Comment: occas    Drug use: No   Sexual activity: Not on file

## 2021-01-24 ENCOUNTER — Ambulatory Visit: Payer: 59 | Admitting: Orthopaedic Surgery

## 2021-08-19 ENCOUNTER — Ambulatory Visit: Payer: Self-pay

## 2021-08-19 ENCOUNTER — Ambulatory Visit (INDEPENDENT_AMBULATORY_CARE_PROVIDER_SITE_OTHER): Payer: 59 | Admitting: Orthopaedic Surgery

## 2021-08-19 DIAGNOSIS — M25512 Pain in left shoulder: Secondary | ICD-10-CM | POA: Diagnosis not present

## 2021-08-19 DIAGNOSIS — M25511 Pain in right shoulder: Secondary | ICD-10-CM | POA: Diagnosis not present

## 2021-08-19 DIAGNOSIS — M7541 Impingement syndrome of right shoulder: Secondary | ICD-10-CM

## 2021-08-19 DIAGNOSIS — G8929 Other chronic pain: Secondary | ICD-10-CM | POA: Diagnosis not present

## 2021-08-19 MED ORDER — LIDOCAINE HCL 1 % IJ SOLN
3.0000 mL | INTRAMUSCULAR | Status: AC | PRN
  Administered 2021-08-19: 3 mL

## 2021-08-19 MED ORDER — METHYLPREDNISOLONE ACETATE 40 MG/ML IJ SUSP
40.0000 mg | INTRAMUSCULAR | Status: AC | PRN
  Administered 2021-08-19: 40 mg via INTRA_ARTICULAR

## 2021-08-19 MED ORDER — MELOXICAM 15 MG PO TABS: 15.0000 mg | ORAL_TABLET | Freq: Every day | ORAL | 3 refills | Status: AC

## 2021-08-19 NOTE — Progress Notes (Signed)
Office Visit Note   Patient: [name]           Date of Birth: Apr 03, 1966           MRN: [id number] Visit Date: 08/19/2021              Requested by: Verl Bangs, MD [address] [place],  [place] [address] PCP: Verl Bangs, MD   Assessment & Plan: Visit Diagnoses:  1. Chronic left shoulder pain   2. Chronic right shoulder pain   3. Impingement syndrome of right shoulder     Plan: His signs and symptoms and clinical exam are consistent with impingement syndrome of his shoulders.  I recommended a steroid injection in the right subacromial outlet and he agreed to this and tolerated well.  He understands this could definitely affect his blood glucose and will watch this.  I will have him take meloxicam daily for at least the next week to 2 weeks and gave him information about looking up impingement syndrome.  All questions and concerns were answered addressed.  Follow-up is as needed.  Since his motion is full I do not think he needs physical therapy.  Follow-Up Instructions: Return if symptoms worsen or fail to improve.   Orders:  Orders Placed This Encounter  Procedures   Large Joint Inj   XR Shoulder Left   XR Shoulder Right   Meds ordered this encounter  Medications   meloxicam (MOBIC) 15 MG tablet    Sig: Take 1 tablet (15 mg total) by mouth daily.    Dispense:  30 tablet    Refill:  3      Procedures: Large Joint Inj: R subacromial bursa on 08/19/2021 3:32 PM Indications: pain and diagnostic evaluation Details: 22 G 1.5 in needle  Arthrogram: No  Medications: 3 mL lidocaine 1 %; 40 mg methylPREDNISolone acetate 40 MG/ML Outcome: tolerated well, no immediate complications Procedure, treatment alternatives, risks and benefits explained, specific risks discussed. Consent was given by the patient. Immediately prior to procedure a time out was called to verify the correct patient, procedure, equipment, support staff and site/side marked as  required. Patient was prepped and draped in the usual sterile fashion.      Clinical Data: No additional findings.   Subjective: Chief Complaint  Patient presents with   Left Shoulder - Pain   Right Shoulder - Pain  [name] comes in today with bilateral shoulder pain with the right worse than left.  It hurts most when he is reaching across and overhead.  The right hurts worse than left.  He has been waking up at night as well and he cannot sleep on his sides.  He denies any specific injury.  He does not do any heavy lifting.  He is 56 years old and well-known to me.  I have replaced his hips.  He is a diabetic and does report his hemoglobin A1c has been in the mid sevens.  He has never had injections or surgery with either shoulder.  He has had cervical spine surgery in the past.  There is no radicular component of his pain with his shoulders.  HPI  Review of Systems There is no listed fever, chills, nausea, vomiting  Objective: Vital Signs: There were no vitals taken for this visit.  Physical Exam He is alert and orient x3 and in no acute distress Ortho Exam Examination of both shoulder shows impingement syndrome with excellent range of motion and 5 out of 5 strength of the  rotator cuff.  His liftoff is negative bilaterally.  He does have positive Neer Hawkins signs mainly on the right side. Specialty Comments:  No specialty comments available.  Imaging: XR Shoulder Left  Result Date: 08/19/2021 3 views of the left shoulder showed no acute findings.  XR Shoulder Right  Result Date: 08/19/2021 3 views of the right shoulder show no acute findings.  The shoulder is well located with good spacing.    PMFS History: Patient Active Problem List   Diagnosis Date Noted   Status post total replacement of right hip 06/29/2020   Status post total hip replacement, right 06/29/2020   Unilateral primary osteoarthritis, right hip 06/28/2020   Snoring 11/01/2015   Hypersomnia 10/31/2015    Degenerative arthritis of hip 09/19/2011   Past Medical History:  Diagnosis Date   Anxiety    Arthritis    bilateral hips   Diabetes mellitus without complication (HCC)    type 2    PONV (postoperative nausea and vomiting)    Sleep apnea    Strep throat 09/17/11   on Zpac    No family history on file.  Past Surgical History:  Procedure Laterality Date   HERNIA REPAIR     left inguinal    NECK SURGERY     TOTAL HIP ARTHROPLASTY  09/19/2011   Procedure: TOTAL HIP ARTHROPLASTY ANTERIOR APPROACH;  Surgeon: Kathryne Hitch, MD;  Location: [facility];  Service: Orthopedics;  Laterality: Left;   TOTAL HIP ARTHROPLASTY Right 06/29/2020   Procedure: RIGHT TOTAL HIP ARTHROPLASTY ANTERIOR APPROACH;  Surgeon: Kathryne Hitch, MD;  Location: [facility];  Service: Orthopedics;  Laterality: Right;   WISDOM TOOTH EXTRACTION     Social History   Occupational History   Not on file  Tobacco Use   Smoking status: Former    Years: 20.00    Types: Cigarettes    Quit date: 09/03/2011    Years since quitting: 9.9   Smokeless tobacco: Never  Vaping Use   Vaping Use: Never used  Substance and Sexual Activity   Alcohol use: Yes    Comment: occas    Drug use: No   Sexual activity: Not on file

## 2022-01-22 ENCOUNTER — Encounter: Payer: Self-pay | Admitting: Physician Assistant

## 2022-01-22 ENCOUNTER — Ambulatory Visit (INDEPENDENT_AMBULATORY_CARE_PROVIDER_SITE_OTHER): Payer: 59

## 2022-01-22 ENCOUNTER — Ambulatory Visit (INDEPENDENT_AMBULATORY_CARE_PROVIDER_SITE_OTHER): Payer: 59 | Admitting: Physician Assistant

## 2022-01-22 DIAGNOSIS — M5441 Lumbago with sciatica, right side: Secondary | ICD-10-CM

## 2022-01-22 MED ORDER — DIAZEPAM 5 MG PO TABS: ORAL_TABLET | ORAL | 0 refills | Status: DC

## 2022-01-22 NOTE — Progress Notes (Signed)
Office Visit Note   Patient: [name]           Date of Birth: February 02, 1966           MRN: [id number] Visit Date: 01/22/2022              Requested by: Verl Bangs, MD [address] [place],  [place] [address] PCP: Verl Bangs, MD   Assessment & Plan: Visit Diagnoses:  1. Acute bilateral low back pain with right-sided sciatica     Plan: Given his continued low back pain with radicular symptoms in the posterior thighs right greater than left for  2 months despite conservative treatment recommend MRI to rule out HNP as a source of his radicular pain.  Having follow-up after the MRI to go over results discuss further treatment.  He is claustrophobic therefore Valium was sent in for him to take prior to the MRI.  Questions were encouraged and answered at length  Follow-Up Instructions: No follow-ups on file.   Orders:  Orders Placed This Encounter  Procedures   XR Lumbar Spine 2-3 Views   Meds ordered this encounter  Medications   DISCONTD: diazepam (VALIUM) 5 MG tablet    Sig: TAKE ONE TAB ONE HOUR PRIOR TO MRI REPEAT AS NEEDED #2 . ZERO REFILLS    Dispense:  2 tablet    Refill:  0   diazepam (VALIUM) 5 MG tablet    Sig: TAKE ONE TAB ONE HOUR PRIOR TO MRI REPEAT AS NEEDED #2 . ZERO REFILLS    Dispense:  2 tablet    Refill:  0      Procedures: No procedures performed   Clinical Data: No additional findings.   Subjective: Chief Complaint  Patient presents with   Lower Back - Pain    HPI Patient returns today status post right shoulder subacromial injection 08/19/2021.  States that shoulder is not bothering him.  He states he has no pain in the shoulder with full range of motion.  However he is having low back pain and has been having low back pain.  Past month and 1/2 to 2 months.  He states he was getting up off the floor and felt like he strained something in his back.  This is his primary care physician and has tried Medrol Dosepak which  gave him some minimal relief, meloxicam, Zanaflex without much relief.  Also tried stretching exercises at home without any real relief.  He finds ice and Excedrin to help the best it is regards to his back pain.  Denies any numbness tingling down either leg.  He states he has pain into the posterior thighs bilaterally right greater than left.  Pain is worse with prolonged sitting or standing.  Initially it was waking him up at night but no longer awakening.  Said no bowel bladder dysfunction saddle anesthesia like symptoms, fevers/chills, or unintentional weight loss. Review of Systems See HPI  Objective: Vital Signs: There were no vitals taken for this visit.  Physical Exam Constitutional:      Appearance: He is not ill-appearing or diaphoretic.  Cardiovascular:     Pulses: Normal pulses.  Pulmonary:     Effort: Pulmonary effort is normal.  Neurological:     Mental Status: He is alert and oriented to person, place, and time.  Psychiatric:        Mood and Affect: Mood normal.     Ortho Exam Bilateral lower extremities 5 out of 5 strength throughout against resistance.  Positive straight leg raise on the right negative on the left.  Forward flexion causes.  Low back pain.  Extension of the lumbar spine causes no discomfort.  Good range of motion bilateral hips without pain.  Nontender over the trochanteric region bilateral hips.  Sensation grossly intact bilateral feet to light touch. Specialty Comments:  No specialty comments available.  Imaging: XR Lumbar Spine 2-3 Views  Result Date: 01/22/2022 Lumbar spine 2 views: No acute fractures.  Disc base overall well-maintained.  No spondylolisthesis.  No bony abnormalities.    PMFS History: Patient Active Problem List   Diagnosis Date Noted   Status post total replacement of right hip 06/29/2020   Status post total hip replacement, right 06/29/2020   Unilateral primary osteoarthritis, right hip 06/28/2020   Snoring 11/01/2015    Hypersomnia 10/31/2015   Degenerative arthritis of hip 09/19/2011   Past Medical History:  Diagnosis Date   Anxiety    Arthritis    bilateral hips   Diabetes mellitus without complication (HCC)    type 2    PONV (postoperative nausea and vomiting)    Sleep apnea    Strep throat 09/17/11   on Zpac    History reviewed. No pertinent family history.  Past Surgical History:  Procedure Laterality Date   HERNIA REPAIR     left inguinal    NECK SURGERY     TOTAL HIP ARTHROPLASTY  09/19/2011   Procedure: TOTAL HIP ARTHROPLASTY ANTERIOR APPROACH;  Surgeon: Kathryne Hitch, MD;  Location: [facility];  Service: Orthopedics;  Laterality: Left;   TOTAL HIP ARTHROPLASTY Right 06/29/2020   Procedure: RIGHT TOTAL HIP ARTHROPLASTY ANTERIOR APPROACH;  Surgeon: Kathryne Hitch, MD;  Location: [facility];  Service: Orthopedics;  Laterality: Right;   WISDOM TOOTH EXTRACTION     Social History   Occupational History   Not on file  Tobacco Use   Smoking status: Former    Years: 20.00    Types: Cigarettes    Quit date: 09/03/2011    Years since quitting: 10.3   Smokeless tobacco: Never  Vaping Use   Vaping Use: Never used  Substance and Sexual Activity   Alcohol use: Yes    Comment: occas    Drug use: No   Sexual activity: Not on file

## 2022-03-10 ENCOUNTER — Other Ambulatory Visit: Payer: Self-pay

## 2022-03-10 DIAGNOSIS — M5441 Lumbago with sciatica, right side: Secondary | ICD-10-CM

## 2022-03-20 ENCOUNTER — Ambulatory Visit
Admission: RE | Admit: 2022-03-20 | Discharge: 2022-03-20 | Disposition: A | Discharge status: Home / Self Care | Payer: 59 | Source: Ambulatory Visit | Attending: Physician Assistant | Admitting: Physician Assistant

## 2022-03-20 DIAGNOSIS — M5441 Lumbago with sciatica, right side: Secondary | ICD-10-CM

## 2022-03-21 ENCOUNTER — Other Ambulatory Visit: Payer: 59

## 2022-04-07 ENCOUNTER — Encounter: Payer: Self-pay | Admitting: Orthopaedic Surgery

## 2022-04-07 ENCOUNTER — Ambulatory Visit (INDEPENDENT_AMBULATORY_CARE_PROVIDER_SITE_OTHER): Payer: 59 | Admitting: Orthopaedic Surgery

## 2022-04-07 DIAGNOSIS — G8929 Other chronic pain: Secondary | ICD-10-CM | POA: Diagnosis not present

## 2022-04-07 DIAGNOSIS — M545 Low back pain, unspecified: Secondary | ICD-10-CM | POA: Diagnosis not present

## 2022-04-07 NOTE — Progress Notes (Signed)
[name] comes in today to go over a MRI of his lumbar spine.  He has had chronic left-sided low back pain for some time now.  I have replaced both of his hips.  He is a thin individual in a diabetic.  His blood glucose runs in the 130s and he reports a hemoglobin A1c of below 7.  He has been seen by pain specialist before and has had a series of injections to the left side in his lumbar spine which sounds like these were facet joint injections.  It sounds like they eventually recommended radiofrequency ablation.  He has not had an MRI of recent.  The MRI of his back shows only mild arthritic changes at multiple levels but no nerve compression at all.  It does seem that there is arthritic changes of the facet joints and I think this was bothering him the most.  Even showing me on physical exam his pain is in the lower aspect of the lumbar spine just left to the midline.  He says it is worse in the morning and does hurt on a daily basis.  I did show him exercises I want him to try twice a day as well as Voltaren gel in this area where he is sore.  It is always worth getting another opinion and potentially seeing Dr. Ernestina Patches for left-sided facet joint injections at L4-L5 and potentially L5-S1 but also discussing his experience with radiofrequency ablation.  [name] said he will think about this and let us know.  All questions and concerns were answered addressed as best I could from my standpoint.

## 2022-05-01 ENCOUNTER — Encounter: Payer: Self-pay | Admitting: Orthopaedic Surgery

## 2022-05-01 ENCOUNTER — Other Ambulatory Visit: Payer: Self-pay

## 2022-05-01 DIAGNOSIS — G8929 Other chronic pain: Secondary | ICD-10-CM

## 2022-05-09 ENCOUNTER — Ambulatory Visit (INDEPENDENT_AMBULATORY_CARE_PROVIDER_SITE_OTHER): Payer: 59 | Admitting: Physical Medicine and Rehabilitation

## 2022-05-09 ENCOUNTER — Encounter: Payer: Self-pay | Admitting: Physical Medicine and Rehabilitation

## 2022-05-09 VITALS — BP 146/85 | HR 64

## 2022-05-09 DIAGNOSIS — M545 Low back pain, unspecified: Secondary | ICD-10-CM

## 2022-05-09 DIAGNOSIS — G8929 Other chronic pain: Secondary | ICD-10-CM

## 2022-05-09 DIAGNOSIS — M47816 Spondylosis without myelopathy or radiculopathy, lumbar region: Secondary | ICD-10-CM | POA: Diagnosis not present

## 2022-05-09 MED ORDER — DIAZEPAM 5 MG PO TABS: ORAL_TABLET | ORAL | 0 refills | Status: DC

## 2022-05-09 NOTE — Progress Notes (Unsigned)
Numeric Pain Rating Scale and Functional Assessment Average Pain 6   In the last MONTH (on 0-10 scale) has pain interfered with the following?  1. General activity like being  able to carry out your everyday physical activities such as walking, climbing stairs, carrying groceries, or moving a chair?  Rating(10)     Lower back pain that starts on the left and radiates to the right. No radiation into legs. Meloxicam, Tizanidine and Excedrin for pain

## 2022-05-09 NOTE — Progress Notes (Unsigned)
[name] - 56 y.o. male MRN [id number]  Date of birth: 09/15/1965  Office Visit Note: Visit Date: 05/09/2022 PCP: Jamesetta Geralds, MD Referred by: Jamesetta Geralds, *  Subjective: Chief Complaint  Patient presents with   Lower Back - Pain   HPI: [name] is a 56 y.o. male who comes in today Per the request of Dr. Jean Rosenthal for evaluation of chronic, worsening and severe bilateral lower back pain. Pain ongoing for several years and is exacerbated by standing and bending. Also reports difficulty sleeping as pain increases with laying flat. He describes pain as sore and aching, currently rates as 8 out of 10. Some relief of pain with home exercise regimen, rest and use of medications. Currently taking Meloxicam, Tizanidine and Excedrin. Recent lumbar MRI imaging reports reads multi level mild facet hypertrophy, however after review degenerative facet changes look more moderate at L4-L5 and L5-S1, worse on the left at L4-L5. No nerve impingement, no high grade spinal canal stenosis noted. Patent underwent bilateral L3-L4, L4-L5 and L5-S1 medial branch blocks with Dr. Dorene Ar at [facility] in 2020, he reports significant relief of pain with this procedure. Patient states he was thinking about proceeding with radiofrequency ablation at that time, however he really wanted a more permanent solution. Patient was recently seen by Dr. Ninfa Linden, per his notes he does recommend continuing with diagnostic medial branch blocks and radiofrequency ablation procedure. Patient states severe pain is negatively impacting his daily life. Patient denies focal weakness, numbness and tingling. Patient denies recent trauma or falls.    Oswestry Disability Index Score 16% 0 to 10 (20%)   minimal disability: The patient can cope with most living activities. Usually no treatment is indicated apart from advice on lifting sitting and exercise.  Review of Systems   Musculoskeletal:  Positive for back pain.  Neurological:  Negative for tingling, sensory change, focal weakness and weakness.  All other systems reviewed and are negative.  Otherwise per HPI.  Assessment & Plan: Visit Diagnoses:    ICD-10-CM   1. Chronic bilateral low back pain without sciatica  M54.50    G89.29     2. Spondylosis without myelopathy or radiculopathy, lumbar region  M47.816     3. Facet hypertrophy of lumbar region  M47.816        Plan: Findings:  Chronic, worsening and severe bilateral axial back pain. No radicular symptoms. Patient continues to have severe pain despite good conservative therapies such as home exercise regimen, rest and use of medications. I did discuss recent lumbar MRI with patient today using imaging and spine model. Patients clinical presentation and exam are consistent with facet mediated pain. Severe pain noted with lumbar extension today. I also feel there is a myofascial component contributing to his pain, there are palpable trigger points noted bilaterally to lumbar paraspinal region. Next step is to perform diagnostic and hopefully therapeutic bilateral L4-L5 and L5-S1 facet joint/medial branch blocks under fluoroscopic guidance. If good relief of pain with diagnostic facet blocks we did discuss possibility of longer sustained pain relief with radiofrequency ablation procedure. Patient did voice concerns regarding anxiety surrounding injection procedure, I did prescribe pre-procedure Valium for him to take on day of injection. We also discussed importance of structured back exercise program which could be physician directed or formal physical therapy/chiropractic treatments. I feel he would benefit from core strengthening, manual treatments and possible dry needling. I did discuss radiofrequency ablation procedure with him in detail today, also provided  him with educational material regarding this procedure to take home and review. I did inform him that  we can repeat radiofrequency ablation as pain relief typically lasts about 8-12 months with this procedure. Patient encouraged to remain active as tolerated. No red flag symptoms noted upon exam today.     Meds & Orders: No orders of the defined types were placed in this encounter.  No orders of the defined types were placed in this encounter.   Follow-up: Return for Bilateral L4-L5 and L5-S1 facet joint/medial branch blocks.   Procedures: No procedures performed      Clinical History: EXAM: MRI LUMBAR SPINE WITHOUT CONTRAST   TECHNIQUE: Multiplanar, multisequence MR imaging of the lumbar spine was performed. No intravenous contrast was administered.   COMPARISON:  None Available.   FINDINGS: Segmentation:  5 lumbar type vertebrae   Alignment:  Physiologic.   Vertebrae:  No fracture, evidence of discitis, or bone lesion.   Conus medullaris and cauda equina: Conus extends to the L2 level. Conus and cauda equina appear normal. Minimal fat deposition in the filum terminalis.   Paraspinal and other soft tissues: Negative for perispinal mass or inflammation.   Disc levels:   L3-L4: Mild disc bulging.   L4-L5: Mild disc bulging and facet spurring   L5-S1:Mild facet spurring.  Tiny central protrusion.   IMPRESSION: Mild degenerative changes described. No neural impingement to explain right leg symptoms.     Electronically Signed   By: Jorje Guild M.D.   On: 03/21/2022 12:25   He reports that he quit smoking about 10 years ago. His smoking use included cigarettes. He has never used smokeless tobacco. No results for input(s): "HGBA1C", "LABURIC" in the last 8760 hours.  Objective:  VS:  HT:    WT:   BMI:     BP:(!) 146/85  HR:64bpm  TEMP: ( )  RESP:  Physical Exam Vitals and nursing note reviewed.  HENT:     Head: Normocephalic and atraumatic.     Right Ear: External ear normal.     Left Ear: External ear normal.     Nose: Nose normal.     Mouth/Throat:      Mouth: Mucous membranes are moist.  Eyes:     Extraocular Movements: Extraocular movements intact.  Cardiovascular:     Rate and Rhythm: Normal rate.     Pulses: Normal pulses.  Pulmonary:     Effort: Pulmonary effort is normal.  Abdominal:     General: Abdomen is flat. There is no distension.  Musculoskeletal:        General: Tenderness present.     Cervical back: Normal range of motion.     Comments: Pt is slow to rise from seated position to standing. Concordant low back pain with facet loading, lumbar spine extension and rotation. Strong distal strength without clonus, no pain upon palpation of greater trochanters. Sensation intact bilaterally. Walks independently, gait steady.   Skin:    General: Skin is warm and dry.     Capillary Refill: Capillary refill takes less than 2 seconds.  Neurological:     General: No focal deficit present.     Mental Status: He is alert and oriented to person, place, and time.  Psychiatric:        Mood and Affect: Mood normal.        Behavior: Behavior normal.     Ortho Exam  Imaging: No results found.  Past Medical/Family/Surgical/Social History: Medications & Allergies reviewed per EMR, new  medications updated. Patient Active Problem List   Diagnosis Date Noted   Status post total replacement of right hip 06/29/2020   Status post total hip replacement, right 06/29/2020   Unilateral primary osteoarthritis, right hip 06/28/2020   Snoring 11/01/2015   Hypersomnia 10/31/2015   Degenerative arthritis of hip 09/19/2011   Past Medical History:  Diagnosis Date   Anxiety    Arthritis    bilateral hips   Diabetes mellitus without complication (HCC)    type 2    PONV (postoperative nausea and vomiting)    Sleep apnea    Strep throat 09/17/11   on Zpac   History reviewed. No pertinent family history. Past Surgical History:  Procedure Laterality Date   HERNIA REPAIR     left inguinal    NECK SURGERY     TOTAL HIP ARTHROPLASTY   09/19/2011   Procedure: TOTAL HIP ARTHROPLASTY ANTERIOR APPROACH;  Surgeon: Mcarthur Rossetti, MD;  Location: [facility];  Service: Orthopedics;  Laterality: Left;   TOTAL HIP ARTHROPLASTY Right 06/29/2020   Procedure: RIGHT TOTAL HIP ARTHROPLASTY ANTERIOR APPROACH;  Surgeon: Mcarthur Rossetti, MD;  Location: [facility];  Service: Orthopedics;  Laterality: Right;   WISDOM TOOTH EXTRACTION     Social History   Occupational History   Not on file  Tobacco Use   Smoking status: Former    Years: 20.00    Types: Cigarettes    Quit date: 09/03/2011    Years since quitting: 10.6   Smokeless tobacco: Never  Vaping Use   Vaping Use: Never used  Substance and Sexual Activity   Alcohol use: Yes    Comment: occas    Drug use: No   Sexual activity: Not on file

## 2022-05-21 ENCOUNTER — Ambulatory Visit: Payer: Self-pay

## 2022-05-21 ENCOUNTER — Ambulatory Visit (INDEPENDENT_AMBULATORY_CARE_PROVIDER_SITE_OTHER): Payer: 59 | Admitting: Physical Medicine and Rehabilitation

## 2022-05-21 VITALS — BP 130/78 | HR 77

## 2022-05-21 DIAGNOSIS — M47816 Spondylosis without myelopathy or radiculopathy, lumbar region: Secondary | ICD-10-CM | POA: Diagnosis not present

## 2022-05-21 MED ORDER — BUPIVACAINE HCL 0.5 % IJ SOLN
3.0000 mL | Freq: Once | INTRAMUSCULAR | Status: AC
  Administered 2022-05-21: 3 mL

## 2022-05-21 NOTE — Progress Notes (Signed)
Numeric Pain Rating Scale and Functional Assessment Average Pain 1   In the last MONTH (on 0-10 scale) has pain interfered with the following?  1. General activity like being  able to carry out your everyday physical activities such as walking, climbing stairs, carrying groceries, or moving a chair?  Rating(7)   +Driver, -BT, -Dye Allergies.  Discomfort in lower back that comes and goes. Hurts the most in the mornings. Standing makes pain worse. No pain radiation into legs

## 2022-05-21 NOTE — Patient Instructions (Signed)

## 2022-05-23 ENCOUNTER — Other Ambulatory Visit: Payer: Self-pay | Admitting: Physical Medicine and Rehabilitation

## 2022-05-23 DIAGNOSIS — M47816 Spondylosis without myelopathy or radiculopathy, lumbar region: Secondary | ICD-10-CM

## 2022-05-23 NOTE — Progress Notes (Signed)
Per pain diary, recent diagnostic medial branch blocks provided 100% relief of pain. We will proceed with second set of diagnostic blocks.

## 2022-05-26 NOTE — Procedures (Signed)
Lumbar Diagnostic Facet Joint Nerve Block with Fluoroscopic Guidance   Patient: [name]      Date of Birth: 08-02-1965 MRN: [id number] PCP: Jamesetta Geralds, MD      Visit Date: 05/21/2022   Universal Protocol:    Date/Time: 11/06/237:39 PM  Consent Given By: the patient  Position: PRONE  Additional Comments: Vital signs were monitored before and after the procedure. Patient was prepped and draped in the usual sterile fashion. The correct patient, procedure, and site was verified.   Injection Procedure Details:   Procedure diagnoses:  1. Spondylosis without myelopathy or radiculopathy, lumbar region      Meds Administered:  Meds ordered this encounter  Medications   bupivacaine (MARCAINE) 0.5 % (with pres) injection 3 mL     Laterality: Bilateral  Location/Site: L4-L5, L3 and L4 medial branches and L5-S1, L4 medial branch and L5 dorsal ramus  Needle: 5.0 in., 25 ga.  Short bevel or Quincke spinal needle  Needle Placement: Oblique pedical  Findings:   -Comments: There was excellent flow of contrast along the articular pillars without intravascular flow.  Procedure Details: The fluoroscope beam is vertically oriented in AP and then obliqued 15 to 20 degrees to the ipsilateral side of the desired nerve to achieve the "Scotty dog" appearance.  The skin over the target area of the junction of the superior articulating process and the transverse process (sacral ala if blocking the L5 dorsal rami) was locally anesthetized with a 1 ml volume of 1% Lidocaine without Epinephrine.  The spinal needle was inserted and advanced in a trajectory view down to the target.   After contact with periosteum and negative aspirate for blood and CSF, correct placement without intravascular or epidural spread was confirmed by injecting 0.5 ml. of Isovue-250.  A spot radiograph was obtained of this image.    Next, a 0.5 ml. volume of the injectate described above was injected. The  needle was then redirected to the other facet joint nerves mentioned above if needed.  Prior to the procedure, the patient was given a Pain Diary which was completed for baseline measurements.  After the procedure, the patient rated their pain every 30 minutes and will continue rating at this frequency for a total of 5 hours.  The patient has been asked to complete the Diary and return to [place] by mail, fax or hand delivered as soon as possible.   Additional Comments:  The patient tolerated the procedure well Dressing: 2 x 2 sterile gauze and Band-Aid    Post-procedure details: Patient was observed during the procedure. Post-procedure instructions were reviewed.  Patient left the clinic in stable condition.

## 2022-05-26 NOTE — Telephone Encounter (Signed)
-----   Message from Magnus Sinning, MD sent at 05/23/2022  9:44 AM EDT ----- Needs second block  ----- Message ----- From: Laflin Lions Sent: 05/23/2022   9:16 AM EDT To: Magnus Sinning, MD

## 2022-05-26 NOTE — Progress Notes (Signed)
Bronson Bressman - 56 y.o. male MRN [id number]  Date of birth: 1965-08-21  Office Visit Note: Visit Date: 05/21/2022 PCP: Jamesetta Geralds, MD Referred by: Jamesetta Geralds, *  Subjective: Chief Complaint  Patient presents with   Lower Back - Pain   HPI:  Yale Golla is a 56 y.o. male who comes in today at the request of Barnet Pall, FNP for planned Bilateral  L4-5 and L5-S1 Lumbar facet/medial branch block with fluoroscopic guidance.  The patient has failed conservative care including home exercise, medications, time and activity modification.  This injection will be diagnostic and hopefully therapeutic.  Please see requesting physician notes for further details and justification.  Exam has shown concordant pain with facet joint loading.   ROS Otherwise per HPI.  Assessment & Plan: Visit Diagnoses:    ICD-10-CM   1. Spondylosis without myelopathy or radiculopathy, lumbar region  M47.816 XR C-ARM NO REPORT    Facet Injection    bupivacaine (MARCAINE) 0.5 % (with pres) injection 3 mL      Plan: No additional findings.   Meds & Orders:  Meds ordered this encounter  Medications   bupivacaine (MARCAINE) 0.5 % (with pres) injection 3 mL    Orders Placed This Encounter  Procedures   Facet Injection   XR C-ARM NO REPORT    Follow-up: Return for Review Pain Diary.   Procedures: No procedures performed  Lumbar Diagnostic Facet Joint Nerve Block with Fluoroscopic Guidance   Patient: Keyontae Huckeby      Date of Birth: July 14, 1966 MRN: [id number] PCP: Jamesetta Geralds, MD      Visit Date: 05/21/2022   Universal Protocol:    Date/Time: 11/06/237:39 PM  Consent Given By: the patient  Position: PRONE  Additional Comments: Vital signs were monitored before and after the procedure. Patient was prepped and draped in the usual sterile fashion. The correct patient, procedure, and site was verified.   Injection Procedure Details:   Procedure  diagnoses:  1. Spondylosis without myelopathy or radiculopathy, lumbar region      Meds Administered:  Meds ordered this encounter  Medications   bupivacaine (MARCAINE) 0.5 % (with pres) injection 3 mL     Laterality: Bilateral  Location/Site: L4-L5, L3 and L4 medial branches and L5-S1, L4 medial branch and L5 dorsal ramus  Needle: 5.0 in., 25 ga.  Short bevel or Quincke spinal needle  Needle Placement: Oblique pedical  Findings:   -Comments: There was excellent flow of contrast along the articular pillars without intravascular flow.  Procedure Details: The fluoroscope beam is vertically oriented in AP and then obliqued 15 to 20 degrees to the ipsilateral side of the desired nerve to achieve the "Scotty dog" appearance.  The skin over the target area of the junction of the superior articulating process and the transverse process (sacral ala if blocking the L5 dorsal rami) was locally anesthetized with a 1 ml volume of 1% Lidocaine without Epinephrine.  The spinal needle was inserted and advanced in a trajectory view down to the target.   After contact with periosteum and negative aspirate for blood and CSF, correct placement without intravascular or epidural spread was confirmed by injecting 0.5 ml. of Isovue-250.  A spot radiograph was obtained of this image.    Next, a 0.5 ml. volume of the injectate described above was injected. The needle was then redirected to the other facet joint nerves mentioned above if needed.  Prior to the procedure, the patient was given a Pain Diary which was completed  for baseline measurements.  After the procedure, the patient rated their pain every 30 minutes and will continue rating at this frequency for a total of 5 hours.  The patient has been asked to complete the Diary and return to [place] by mail, fax or hand delivered as soon as possible.   Additional Comments:  The patient tolerated the procedure well Dressing: 2 x 2 sterile gauze and Band-Aid     Post-procedure details: Patient was observed during the procedure. Post-procedure instructions were reviewed.  Patient left the clinic in stable condition.   Clinical History: EXAM: MRI LUMBAR SPINE WITHOUT CONTRAST   TECHNIQUE: Multiplanar, multisequence MR imaging of the lumbar spine was performed. No intravenous contrast was administered.   COMPARISON:  None Available.   FINDINGS: Segmentation:  5 lumbar type vertebrae   Alignment:  Physiologic.   Vertebrae:  No fracture, evidence of discitis, or bone lesion.   Conus medullaris and cauda equina: Conus extends to the L2 level. Conus and cauda equina appear normal. Minimal fat deposition in the filum terminalis.   Paraspinal and other soft tissues: Negative for perispinal mass or inflammation.   Disc levels:   L3-L4: Mild disc bulging.   L4-L5: Mild disc bulging and facet spurring   L5-S1:Mild facet spurring.  Tiny central protrusion.   IMPRESSION: Mild degenerative changes described. No neural impingement to explain right leg symptoms.     Electronically Signed   By: Jorje Guild M.D.   On: 03/21/2022 12:25     Objective:  VS:  HT:    WT:   BMI:     BP:130/78  HR:77bpm  TEMP: ( )  RESP:  Physical Exam Vitals and nursing note reviewed.  Constitutional:      General: He is not in acute distress.    Appearance: Normal appearance. He is not ill-appearing.  HENT:     Head: Normocephalic and atraumatic.     Right Ear: External ear normal.     Left Ear: External ear normal.     Nose: No congestion.  Eyes:     Extraocular Movements: Extraocular movements intact.  Cardiovascular:     Rate and Rhythm: Normal rate.     Pulses: Normal pulses.  Pulmonary:     Effort: Pulmonary effort is normal. No respiratory distress.  Abdominal:     General: There is no distension.     Palpations: Abdomen is soft.  Musculoskeletal:        General: No tenderness or signs of injury.     Cervical back: Neck  supple.     Right lower leg: No edema.     Left lower leg: No edema.     Comments: Patient has good distal strength without clonus.  Skin:    Findings: No erythema or rash.  Neurological:     General: No focal deficit present.     Mental Status: He is alert and oriented to person, place, and time.     Sensory: No sensory deficit.     Motor: No weakness or abnormal muscle tone.     Coordination: Coordination normal.  Psychiatric:        Mood and Affect: Mood normal.        Behavior: Behavior normal.      Imaging: No results found.

## 2022-05-28 ENCOUNTER — Encounter: Payer: Self-pay | Admitting: Physical Medicine and Rehabilitation

## 2022-05-28 ENCOUNTER — Ambulatory Visit (INDEPENDENT_AMBULATORY_CARE_PROVIDER_SITE_OTHER): Payer: 59 | Admitting: Physical Medicine and Rehabilitation

## 2022-05-28 ENCOUNTER — Ambulatory Visit: Payer: Self-pay

## 2022-05-28 VITALS — BP 135/86 | HR 71

## 2022-05-28 DIAGNOSIS — M47816 Spondylosis without myelopathy or radiculopathy, lumbar region: Secondary | ICD-10-CM

## 2022-05-28 MED ORDER — BUPIVACAINE HCL 0.5 % IJ SOLN
3.0000 mL | Freq: Once | INTRAMUSCULAR | Status: AC
  Administered 2022-05-28: 3 mL

## 2022-05-28 NOTE — Progress Notes (Signed)
Numeric Pain Rating Scale and Functional Assessment Average Pain 3   In the last MONTH (on 0-10 scale) has pain interfered with the following?  1. General activity like being  able to carry out your everyday physical activities such as walking, climbing stairs, carrying groceries, or moving a chair?  Rating(8)   +Driver, -BT, -Dye Allergies.  Standing or walking makes pain worse. Pain on left lower back

## 2022-05-28 NOTE — Patient Instructions (Signed)

## 2022-05-29 ENCOUNTER — Other Ambulatory Visit: Payer: Self-pay | Admitting: Physical Medicine and Rehabilitation

## 2022-05-29 DIAGNOSIS — M47816 Spondylosis without myelopathy or radiculopathy, lumbar region: Secondary | ICD-10-CM

## 2022-05-29 DIAGNOSIS — G8929 Other chronic pain: Secondary | ICD-10-CM

## 2022-05-29 MED ORDER — DIAZEPAM 5 MG PO TABS: ORAL_TABLET | ORAL | 0 refills | Status: AC

## 2022-05-29 NOTE — Progress Notes (Signed)
Per pain diary 100% relief of pain with second set of diagnostic medial branch blocks. We will proceed with radiofrequency ablation procedure. I will call in pre-procedure Valium.

## 2022-06-10 NOTE — Progress Notes (Signed)
[name] - 56 y.o. male MRN [id number]  Date of birth: 08/17/1965  Office Visit Note: Visit Date: 05/28/2022 PCP: Verl Bangs, MD Referred by: Verl Bangs, *  Subjective: Chief Complaint  Patient presents with   Lower Back - Pain   HPI:  [name] is a 56 y.o. male who comes in today for planned repeat Bilateral L4-5 and L5-S1 Lumbar facet/medial branch block with fluoroscopic guidance.  The patient has failed conservative care including home exercise, medications, time and activity modification.  This injection will be diagnostic and hopefully therapeutic.  Please see requesting physician notes for further details and justification.  Exam shows concordant low back pain with facet joint loading and extension. Patient received more than 80% pain relief from prior injection. This would be the second block in a diagnostic double block paradigm.     Referring:Megan Mayford Knife, FNP   ROS Otherwise per HPI.  Assessment & Plan: Visit Diagnoses:    ICD-10-CM   1. Spondylosis without myelopathy or radiculopathy, lumbar region  M47.816 XR C-ARM NO REPORT    Facet Injection    bupivacaine (MARCAINE) 0.5 % (with pres) injection 3 mL      Plan: No additional findings.   Meds & Orders:  Meds ordered this encounter  Medications   bupivacaine (MARCAINE) 0.5 % (with pres) injection 3 mL    Orders Placed This Encounter  Procedures   Facet Injection   XR C-ARM NO REPORT    Follow-up: Return for Review Pain Diary.   Procedures: No procedures performed      Clinical History: EXAM: MRI LUMBAR SPINE WITHOUT CONTRAST   TECHNIQUE: Multiplanar, multisequence MR imaging of the lumbar spine was performed. No intravenous contrast was administered.   COMPARISON:  None Available.   FINDINGS: Segmentation:  5 lumbar type vertebrae   Alignment:  Physiologic.   Vertebrae:  No fracture, evidence of discitis, or bone lesion.   Conus medullaris and cauda  equina: Conus extends to the L2 level. Conus and cauda equina appear normal. Minimal fat deposition in the filum terminalis.   Paraspinal and other soft tissues: Negative for perispinal mass or inflammation.   Disc levels:   L3-L4: Mild disc bulging.   L4-L5: Mild disc bulging and facet spurring   L5-S1:Mild facet spurring.  Tiny central protrusion.   IMPRESSION: Mild degenerative changes described. No neural impingement to explain right leg symptoms.     Electronically Signed   By: Tiburcio Pea M.D.   On: 03/21/2022 12:25     Objective:  VS:  HT:    WT:   BMI:     BP:135/86  HR:71bpm  TEMP: ( )  RESP:  Physical Exam Vitals and nursing note reviewed.  Constitutional:      General: He is not in acute distress.    Appearance: Normal appearance. He is not ill-appearing.  HENT:     Head: Normocephalic and atraumatic.     Right Ear: External ear normal.     Left Ear: External ear normal.     Nose: No congestion.  Eyes:     Extraocular Movements: Extraocular movements intact.  Cardiovascular:     Rate and Rhythm: Normal rate.     Pulses: Normal pulses.  Pulmonary:     Effort: Pulmonary effort is normal. No respiratory distress.  Abdominal:     General: There is no distension.     Palpations: Abdomen is soft.  Musculoskeletal:        General: No tenderness or  signs of injury.     Cervical back: Neck supple.     Right lower leg: No edema.     Left lower leg: No edema.     Comments: Patient has good distal strength without clonus.  Skin:    Findings: No erythema or rash.  Neurological:     General: No focal deficit present.     Mental Status: He is alert and oriented to person, place, and time.     Sensory: No sensory deficit.     Motor: No weakness or abnormal muscle tone.     Coordination: Coordination normal.  Psychiatric:        Mood and Affect: Mood normal.        Behavior: Behavior normal.      Imaging: No results found.

## 2022-06-24 ENCOUNTER — Telehealth: Payer: Self-pay | Admitting: Physical Medicine and Rehabilitation

## 2022-06-24 ENCOUNTER — Other Ambulatory Visit: Payer: Self-pay | Admitting: Physical Medicine and Rehabilitation

## 2022-06-24 DIAGNOSIS — M545 Low back pain, unspecified: Secondary | ICD-10-CM

## 2022-06-24 DIAGNOSIS — M47816 Spondylosis without myelopathy or radiculopathy, lumbar region: Secondary | ICD-10-CM

## 2022-06-24 NOTE — Telephone Encounter (Signed)
Spoke with patient and he would like to cancel his RFA appointments. He wants to try alternate things because he feels he jumped into the ablation. He would like to try PT here to see if that helps with his pain. Please advise

## 2022-06-24 NOTE — Telephone Encounter (Signed)
Pt called to cancel both appt s with Dr Alvester Morin. Please call and confirm both appts get cancelled. Pt phone number is [phone]

## 2022-06-24 NOTE — Progress Notes (Signed)
Patient would like to hold on radiofrequency ablation at this time. He is requesting PT, I did place order for PT with our in house team. Patient instructed to follow up with [place] as needed.

## 2022-06-25 ENCOUNTER — Encounter: Payer: 59 | Admitting: Physical Medicine and Rehabilitation

## 2022-07-01 ENCOUNTER — Ambulatory Visit: Payer: 59 | Attending: Physical Medicine and Rehabilitation | Admitting: Physical Therapy

## 2022-07-01 ENCOUNTER — Encounter: Payer: Self-pay | Admitting: Physical Therapy

## 2022-07-01 ENCOUNTER — Other Ambulatory Visit: Payer: Self-pay

## 2022-07-01 DIAGNOSIS — R2689 Other abnormalities of gait and mobility: Secondary | ICD-10-CM

## 2022-07-01 DIAGNOSIS — G8929 Other chronic pain: Secondary | ICD-10-CM | POA: Insufficient documentation

## 2022-07-01 DIAGNOSIS — M47816 Spondylosis without myelopathy or radiculopathy, lumbar region: Secondary | ICD-10-CM | POA: Diagnosis present

## 2022-07-01 DIAGNOSIS — M6281 Muscle weakness (generalized): Secondary | ICD-10-CM | POA: Diagnosis not present

## 2022-07-01 DIAGNOSIS — M545 Low back pain, unspecified: Secondary | ICD-10-CM | POA: Insufficient documentation

## 2022-07-01 DIAGNOSIS — R269 Unspecified abnormalities of gait and mobility: Secondary | ICD-10-CM | POA: Insufficient documentation

## 2022-07-01 NOTE — Therapy (Signed)
OUTPATIENT PHYSICAL THERAPY THORACOLUMBAR EVALUATION   Patient Name: Adonis HugueninGabriel [place] MRN: [id number] DOB:1965/08/30, 56 y.o., male Today's Date: 07/01/2022  END OF SESSION:  PT End of Session - 07/01/22 1144     Visit Number 1    Number of Visits 16    Date for PT Re-Evaluation 08/26/22    Authorization Type [employer]    PT Start Time 1145    PT Stop Time 1230    PT Time Calculation (min) 45 min    Behavior During Therapy WFL for tasks assessed/performed             Past Medical History:  Diagnosis Date   Anxiety    Arthritis    bilateral hips   Diabetes mellitus without complication (HCC)    type 2    PONV (postoperative nausea and vomiting)    Sleep apnea    Strep throat 09/17/11   on Zpac   Past Surgical History:  Procedure Laterality Date   HERNIA REPAIR     left inguinal    NECK SURGERY     TOTAL HIP ARTHROPLASTY  09/19/2011   Procedure: TOTAL HIP ARTHROPLASTY ANTERIOR APPROACH;  Surgeon: Kathryne Hitchhristopher Y Blackman, MD;  Location: [facility];  Service: Orthopedics;  Laterality: Left;   TOTAL HIP ARTHROPLASTY Right 06/29/2020   Procedure: RIGHT TOTAL HIP ARTHROPLASTY ANTERIOR APPROACH;  Surgeon: Kathryne HitchBlackman, Christopher Y, MD;  Location: [facility];  Service: Orthopedics;  Laterality: Right;   WISDOM TOOTH EXTRACTION     Patient Active Problem List   Diagnosis Date Noted   Status post total replacement of right hip 06/29/2020   Status post total hip replacement, right 06/29/2020   Unilateral primary osteoarthritis, right hip 06/28/2020   Snoring 11/01/2015   Hypersomnia 10/31/2015   Degenerative arthritis of hip 09/19/2011    PCP: Verl Bangsadiontchenko, Alexei  REFERRING PROVIDER: Ellin GoodieWilliams, Megan  REFERRING DIAG:  M54.50,G89.29 (ICD-10-CM) - Chronic bilateral low back pain without sciatica  M47.816 (ICD-10-CM) - Spondylosis without myelopathy or radiculopathy, lumbar region  M47.816 (ICD-10-CM) - Facet hypertrophy of lumbar region    Rationale for Evaluation and Treatment:  Rehabilitation  THERAPY DIAG:  Chronic bilateral low back pain without sciatica  Muscle weakness (generalized)  Other abnormalities of gait and mobility  Spondylosis without myelopathy or radiculopathy, lumbar region  Facet hypertrophy of lumbar region  ONSET DATE: Chronic and ongoing for multiple years  SUBJECTIVE:                                                                                                                                                                                           SUBJECTIVE STATEMENT: Pt reports chronic  and ongoing back pain for many years. Pt states he has tried chiropractic care, acupuncture, and massage therapy. Massage therapy seems to help for a day or two but then after that it goes back to the same level of pain. Pain varies throughout the day -- most prevalent in the morning. Hurts to put on socks and shoes. Last bad flare up 2-3 months ago.   PERTINENT HISTORY:  R & L THA  PAIN:  Are you having pain? Yes: NPRS scale: 0 currently, at its worst 9/10 Pain location: lower lumbar -- more on the left than right Pain description: at worst a little numbness in the L leg; discomfort Aggravating factors: Not sure what aggravates it-- but can feel it with bending; waiting in line/prolonged standing Relieving factors: massage therapy, heat  PRECAUTIONS: None  WEIGHT BEARING RESTRICTIONS: No  FALLS:  Has patient fallen in last 6 months? 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 No neural impingement  to explain right leg symptoms.  PATIENT SURVEYS:  FOTO 53; predicted 58  SCREENING FOR RED FLAGS: Bowel or bladder incontinence: No Spinal tumors: No Cauda equina syndrome: No Compression fracture: No Abdominal aneurysm: No  COGNITION: Overall cognitive status: Within functional limits for tasks assessed     SENSATION: WFL  MUSCLE LENGTH: Hamstrings: Right 90 deg; Left 90 deg Thomas test: Right 35 deg; Left 15 deg  POSTURE: decreased lumbar lordosis and decreased thoracic kyphosis  PALPATION: Mild tenderness to palpation bilat QL into superior ilium and upper glutes, tone notable in bilat paraspinals L>R  LUMBAR ROM:   AROM eval  Flexion WFL  Extension WFL  Right lateral flexion WFL  Left lateral flexion WFL  Right rotation WFL  Left rotation WFL   (Blank rows = not tested)  LOWER EXTREMITY ROM:     Active  Right eval Left eval  Hip flexion    Hip extension    Hip abduction    Hip adduction    Hip internal rotation    Hip external rotation    Knee flexion    Knee extension    Ankle dorsiflexion    Ankle plantarflexion    Ankle inversion    Ankle eversion     (Blank rows = not tested)  LOWER EXTREMITY MMT:    MMT Right eval Left eval  Hip flexion 5 5  Hip extension 3+ 3+  Hip abduction 3+ 3+  Hip adduction 3+ 3+  Hip internal rotation 4 4  Hip external rotation 4 4  Knee flexion 4+ 4+  Knee extension 4+ 4+  Ankle dorsiflexion    Ankle plantarflexion    Ankle inversion    Ankle eversion     (Blank rows = not tested)  LUMBAR SPECIAL TESTS:  Straight leg raise test: Negative, Slump test: Negative, FABER test: Negative, and Thomas test: Positive, Knee bend (+) bilat (on L can feel on right side of back as well)  FUNCTIONAL TESTS:  Did not assess this session  GAIT: Distance walked: 150' Assistive device utilized: None Level of assistance: Complete Independence Comments: WFL  TODAY'S TREATMENT:  DATE: 07/01/22 Trialed seated hip flexor stretching; however, pt felt pull into low back Exercises - Hip Flexor Stretch at Edge of Bed  - 1 x daily - 7 x weekly - 2 sets - 30 sec hold - Hip Flexor Stretch with Chair  - 1 x daily - 7 x weekly - 2 sets - 30 sec hold - Hip Abduction with Resistance Loop  - 1 x daily - 7 x weekly - 2 sets - 10 reps - Hip Extension with Resistance Loop  - 1 x daily - 7 x weekly - 2 sets - 10 reps   PATIENT EDUCATION:  Education details: Exam findings, POC, HEP Person educated: Patient Education method: Explanation, Demonstration, and Handouts Education comprehension: verbalized understanding, returned demonstration, and needs further education  HOME EXERCISE PROGRAM: Access Code: [email or web address] URL: [email or web address] Date: 07/01/2022 Prepared by: Vernon Prey April Kirstie Peri  Exercises - Hip Flexor Stretch at [facility] of Bed  - 1 x daily - 7 x weekly - 2 sets - 30 sec hold - Hip Flexor Stretch with Chair  - 1 x daily - 7 x weekly - 2 sets - 30 sec hold - Hip Abduction with Resistance Loop  - 1 x daily - 7 x weekly - 2 sets - 10 reps - Hip Extension with Resistance Loop  - 1 x daily - 7 x weekly - 2 sets - 10 reps  ASSESSMENT:  CLINICAL IMPRESSION: Patient is a 56 y.o. M who was seen today for physical therapy evaluation and treatment for chronic low back pain. Assessment significant for weak bilat hips with tight hip flexors and increased compensatory back movements with hip movement. Pt would benefit from PT to address these issues to improving bending, lifting, and standing tolerance with less pain.   OBJECTIVE IMPAIRMENTS: decreased endurance, decreased mobility, decreased strength, increased fascial restrictions, increased muscle spasms, improper body mechanics, postural dysfunction, and pain.   ACTIVITY LIMITATIONS: lifting, bending, and  standing  PARTICIPATION LIMITATIONS: cleaning, shopping, occupation, and yard work  PERSONAL FACTORS: Age, Fitness, Past/current experiences, Profession, and Time since onset of injury/illness/exacerbation are also affecting patient's functional outcome.   REHAB POTENTIAL: Good  CLINICAL DECISION MAKING: Evolving/moderate complexity  EVALUATION COMPLEXITY: Moderate   GOALS: Goals reviewed with patient? Yes  SHORT TERM GOALS: Target date: 07/29/2022   Pt will be ind with initial HEP Baseline: Goal status: INITIAL  2.  Pt will report >/=25% improvement with bending movements Baseline:  Goal status: INITIAL    LONG TERM GOALS: Target date: 08/26/2022   Pt will be ind with continuing and progressing HEP Baseline:  Goal status: INITIAL  2.  Pt will be able to demonstrate good body mechanics for back safety with all lifting tasks Baseline:  Goal status: INITIAL  3.  Pt will be able to lift at least 25# with no pain to demo improved functional LE strength Baseline:  Goal status: INITIAL  4.  Pt will be able to get bilat thighs flat on mat table during Thomas stretch to demo improved hip flexor length Baseline:  Goal status: INITIAL  5.  Pt will be able to stand at least 20 min with >/=50% improvement in back pain for shopping Baseline:  Goal status: INITIAL  6.  Pt will have improved FOTO score to >/=61 Baseline:  Goal status: INITIAL  PLAN:  PT FREQUENCY: 2x/week  PT DURATION: 8 weeks  PLANNED INTERVENTIONS: Therapeutic exercises, Therapeutic activity, Neuromuscular re-education, Balance training, Gait training, Patient/Family education, Self Care, Joint mobilization, Stair  training, Aquatic Therapy, Dry Needling, Electrical stimulation, Spinal mobilization, Cryotherapy, Moist heat, Taping, Ultrasound, Ionotophoresis 4mg /ml Dexamethasone, and Manual therapy.  PLAN FOR NEXT SESSION: Assess response to HEP. Manual work as indicated for lumbar/sacrum/hip flexors.  Continue to stretch psoas/quads/hip flexors. Strengthen bilat hips and initiate core strengthening.    Naksh Radi April Ma L Sagrario Lineberry, PT 07/01/2022, 1:13 PM

## 2022-07-02 ENCOUNTER — Encounter: Payer: 59 | Admitting: Physical Medicine and Rehabilitation

## 2022-07-08 ENCOUNTER — Ambulatory Visit: Payer: 59 | Admitting: Physical Therapy

## 2022-07-08 ENCOUNTER — Encounter: Payer: Self-pay | Admitting: Physical Therapy

## 2022-07-08 DIAGNOSIS — M47816 Spondylosis without myelopathy or radiculopathy, lumbar region: Secondary | ICD-10-CM | POA: Diagnosis not present

## 2022-07-08 DIAGNOSIS — R2689 Other abnormalities of gait and mobility: Secondary | ICD-10-CM

## 2022-07-08 DIAGNOSIS — M6281 Muscle weakness (generalized): Secondary | ICD-10-CM

## 2022-07-08 DIAGNOSIS — G8929 Other chronic pain: Secondary | ICD-10-CM

## 2022-07-08 NOTE — Therapy (Signed)
OUTPATIENT PHYSICAL THERAPY THORACOLUMBAR EVALUATION   Patient Name: [name] MRN: [id number] DOB:August 22, 1965, 56 y.o., male Today's Date: 07/08/2022  END OF SESSION:  PT End of Session - 07/08/22 1142     Visit Number 2    Number of Visits 16    Date for PT Re-Evaluation 08/26/22    Authorization Type UHC    PT Start Time 1145    PT Stop Time 1230    PT Time Calculation (min) 45 min    Activity Tolerance Patient tolerated treatment well    Behavior During Therapy WFL for tasks assessed/performed              Past Medical History:  Diagnosis Date   Anxiety    Arthritis    bilateral hips   Diabetes mellitus without complication (HCC)    type 2    PONV (postoperative nausea and vomiting)    Sleep apnea    Strep throat 09/17/11   on Zpac   Past Surgical History:  Procedure Laterality Date   HERNIA REPAIR     left inguinal    NECK SURGERY     TOTAL HIP ARTHROPLASTY  09/19/2011   Procedure: TOTAL HIP ARTHROPLASTY ANTERIOR APPROACH;  Surgeon: Kathryne Hitch, MD;  Location: [facility];  Service: Orthopedics;  Laterality: Left;   TOTAL HIP ARTHROPLASTY Right 06/29/2020   Procedure: RIGHT TOTAL HIP ARTHROPLASTY ANTERIOR APPROACH;  Surgeon: Kathryne Hitch, MD;  Location: [facility];  Service: Orthopedics;  Laterality: Right;   WISDOM TOOTH EXTRACTION     Patient Active Problem List   Diagnosis Date Noted   Status post total replacement of right hip 06/29/2020   Status post total hip replacement, right 06/29/2020   Unilateral primary osteoarthritis, right hip 06/28/2020   Snoring 11/01/2015   Hypersomnia 10/31/2015   Degenerative arthritis of hip 09/19/2011    PCP: Verl Bangs  REFERRING PROVIDER: Ellin Goodie  REFERRING DIAG:  M54.50,G89.29 (ICD-10-CM) - Chronic bilateral low back pain without sciatica  M47.816 (ICD-10-CM) - Spondylosis without myelopathy or radiculopathy, lumbar region  M47.816 (ICD-10-CM) - Facet hypertrophy of  lumbar region    Rationale for Evaluation and Treatment: Rehabilitation  THERAPY DIAG:  Chronic bilateral low back pain without sciatica  Muscle weakness (generalized)  Other abnormalities of gait and mobility  ONSET DATE: Chronic and ongoing for multiple years  SUBJECTIVE:                                                                                                                                                                                           SUBJECTIVE STATEMENT: Pt reports he was standing earlier and  his back started hurting. Can still feel it some right now. Pt states no problems with the band exercises - can feel it some in the left hip when he does his hip flexor stretch on the stairs. Pt notes he's been doing a lot of lifting at work to move from one office to another.   PERTINENT HISTORY:  R & L THA  From eval: Pt reports chronic and ongoing back pain for many years. Pt states he has tried chiropractic care, acupuncture, and massage therapy. Massage therapy seems to help for a day or two but then after that it goes back to the same level of pain. Pain varies throughout the day -- most prevalent in the morning. Hurts to put on socks and shoes. Last bad flare up 2-3 months ago.   PAIN:  Are you having pain? Yes: NPRS scale: 2 or 3/10 Pain location: lower lumbar -- more on the left than right Pain description: at worst a little numbness in the L leg; discomfort Aggravating factors: Not sure what aggravates it-- but can feel it with bending; waiting in line/prolonged standing Relieving factors: massage therapy, heat  PRECAUTIONS: None  WEIGHT BEARING RESTRICTIONS: No  FALLS:  Has patient fallen in last 6 months? 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  PLOF: Independent  PATIENT GOALS: Improve mobility and pain/stiffness; return to hiking and biking  NEXT MD VISIT: n/a  OBJECTIVE:   MUSCLE LENGTH: Hamstrings: Right 90 deg; Left 90  deg Thomas test: Right 35 deg; Left 15 deg  POSTURE: decreased lumbar lordosis and decreased thoracic kyphosis  PALPATION: Mild tenderness to palpation bilat QL into superior ilium and upper glutes, tone notable in bilat paraspinals L>R  LOWER EXTREMITY MMT:    MMT Right eval Left eval  Hip flexion 5 5  Hip extension 3+ 3+  Hip abduction 3+ 3+  Hip adduction 3+ 3+  Hip internal rotation 4 4  Hip external rotation 4 4  Knee flexion 4+ 4+  Knee extension 4+ 4+  Ankle dorsiflexion    Ankle plantarflexion    Ankle inversion    Ankle eversion     (Blank rows = not tested)  LUMBAR SPECIAL TESTS:  Straight leg raise test: Negative, Slump test: Negative, FABER test: Negative, and Thomas test: Positive, Knee bend (+) bilat (on L can feel on right side of back as well)   [facility] Adult PT Treatment:                                                DATE: 07/08/22 Therapeutic Exercise: Treadmill 1.7 mph x 5 min Supine Hip flexor stretch 2x30 sec Hip flexor contract/relax 5x5 sec Ab set + double knee bend 2x10 LTR 3x10 sec Prone Hip ext x10, with green TB x10 Sidelying Hip abd green TB 2x10 Hip add 2x10 Standing Hip abd green TB 2x10 cues to keep from using back Hip ext green TB 2x10 cues to keep from using back Palloff press green TB 2x10x3 sec Manual Therapy: STM & TPR psoas major and QL    TODAY'S TREATMENT:  DATE: 07/01/22 Access Code: [email or web address] URL: [email or web address] Date: 07/08/2022 Prepared by: Vernon PreyGellen April Kirstie PeriMarie Kyzen Horn  Exercises - Hip Abduction with Resistance Loop  - 1 x daily - 7 x weekly - 2 sets - 10 reps - Hip Extension with Resistance Loop  - 1 x daily - 7 x weekly - 2 sets - 10 reps - Sidelying ITB Stretch off Table  - 1 x daily - 7 x weekly - 2 sets - 30 sec hold - Supine Lower Trunk Rotation  - 1 x daily - 7 x  weekly - 3 sets - 10 sec hold - Bilateral Bent Leg Lift  - 1 x daily - 7 x weekly - 2 sets - 10 reps - Standing Anti-Rotation Press with Anchored Resistance  - 1 x daily - 7 x weekly - 2 sets - 10 reps - 3 sec hold   PATIENT EDUCATION:  Education details: Exam findings, POC, HEP Person educated: Patient Education method: Explanation, Demonstration, and Handouts Education comprehension: verbalized understanding, returned demonstration, and needs further education  HOME EXERCISE PROGRAM: Access Code: [email or web address] URL: [email or web address] Date: 07/01/2022 Prepared by: Vernon PreyGellen April Kirstie PeriMarie Isack Lavalley  Exerc[facility] - [facility]  - 1 x daily - 7 x weekly - 2 sets - 30 sec hold - Hip Flexor Stretch with Chair  - 1 x daily - 7 x weekly - 2 sets - 30 sec hold - Hip Abduction with Resistance Loop  - 1 x daily - 7 x weekly - 2 sets - 10 reps - Hip Extension with Resistance Loop  - 1 x daily - 7 x weekly - 2 sets - 10 reps  ASSESSMENT:  CLINICAL IMPRESSION: Session focused on hip and core strengthening. Pt able to progress to green TB this session. Pt with difficulty performing supine hip flexor stretch at home and has discomfort with standing hip flexor stretch -- modified to sidelying stretch. Worked on improving psoas muscle length.   OBJECTIVE IMPAIRMENTS: decreased endurance, decreased mobility, decreased strength, increased fascial restrictions, increased muscle spasms, improper body mechanics, postural dysfunction, and pain.   ACTIVITY LIMITATIONS: lifting, bending, and standing  PARTICIPATION LIMITATIONS: cleaning, shopping, occupation, and yard work  PERSONAL FACTORS: Age, Fitness, Past/current experiences, Profession, and Time since onset of injury/illness/exacerbation are also affecting patient's functional outcome.   REHAB POTENTIAL: Good  CLINICAL DECISION MAKING: Evolving/moderate complexity  EVALUATION COMPLEXITY: Moderate   GOALS: Goals  reviewed with patient? Yes  SHORT TERM GOALS: Target date: 07/29/2022   Pt will be ind with initial HEP Baseline: Goal status: INITIAL  2.  Pt will report >/=25% improvement with bending movements Baseline:  Goal status: INITIAL    LONG TERM GOALS: Target date: 08/26/2022   Pt will be ind with continuing and progressing HEP Baseline:  Goal status: INITIAL  2.  Pt will be able to demonstrate good body mechanics for back safety with all lifting tasks Baseline:  Goal status: INITIAL  3.  Pt will be able to lift at least 25# with no pain to demo improved functional LE strength Baseline:  Goal status: INITIAL  4.  Pt will be able to get bilat thighs flat on mat table during Thomas stretch to demo improved hip flexor length Baseline:  Goal status: INITIAL  5.  Pt will be able to stand at least 20 min with >/=50% improvement in back pain for shopping Baseline:  Goal status: INITIAL  6.  Pt will have improved FOTO score to >/=61 Baseline:  Goal  status: INITIAL  PLAN:  PT FREQUENCY: 2x/week  PT DURATION: 8 weeks  PLANNED INTERVENTIONS: Therapeutic exercises, Therapeutic activity, Neuromuscular re-education, Balance training, Gait training, Patient/Family education, Self Care, Joint mobilization, Stair training, Aquatic Therapy, Dry Needling, Electrical stimulation, Spinal mobilization, Cryotherapy, Moist heat, Taping, Ultrasound, Ionotophoresis 4mg /ml Dexamethasone, and Manual therapy.  PLAN FOR NEXT SESSION: Assess response to HEP. Manual work as indicated for lumbar/sacrum/hip flexors. Continue to stretch psoas/quads/hip flexors. Strengthen bilat hips and initiate core strengthening.    Zimal Weisensel April Ma L Petina Muraski, PT 07/08/2022, 11:42 AM

## 2022-07-09 IMAGING — RF DG HIP (WITH PELVIS) OPERATIVE*R*
1 series · 3 of 3 positions shown · non-contrast
Comparison: 04/25/2020.

CLINICAL DATA: Total right hip arthroplasty.

EXAM:
OPERATIVE RIGHT HIP (WITH PELVIS IF PERFORMED) 3 VIEWS
TECHNIQUE: Fluoroscopic spot image(s) were submitted for interpretation
post-operatively.

[Series 1: unknown protocol · 0.20mm/px · 3 of 3 slices shown]
[im 1/3]
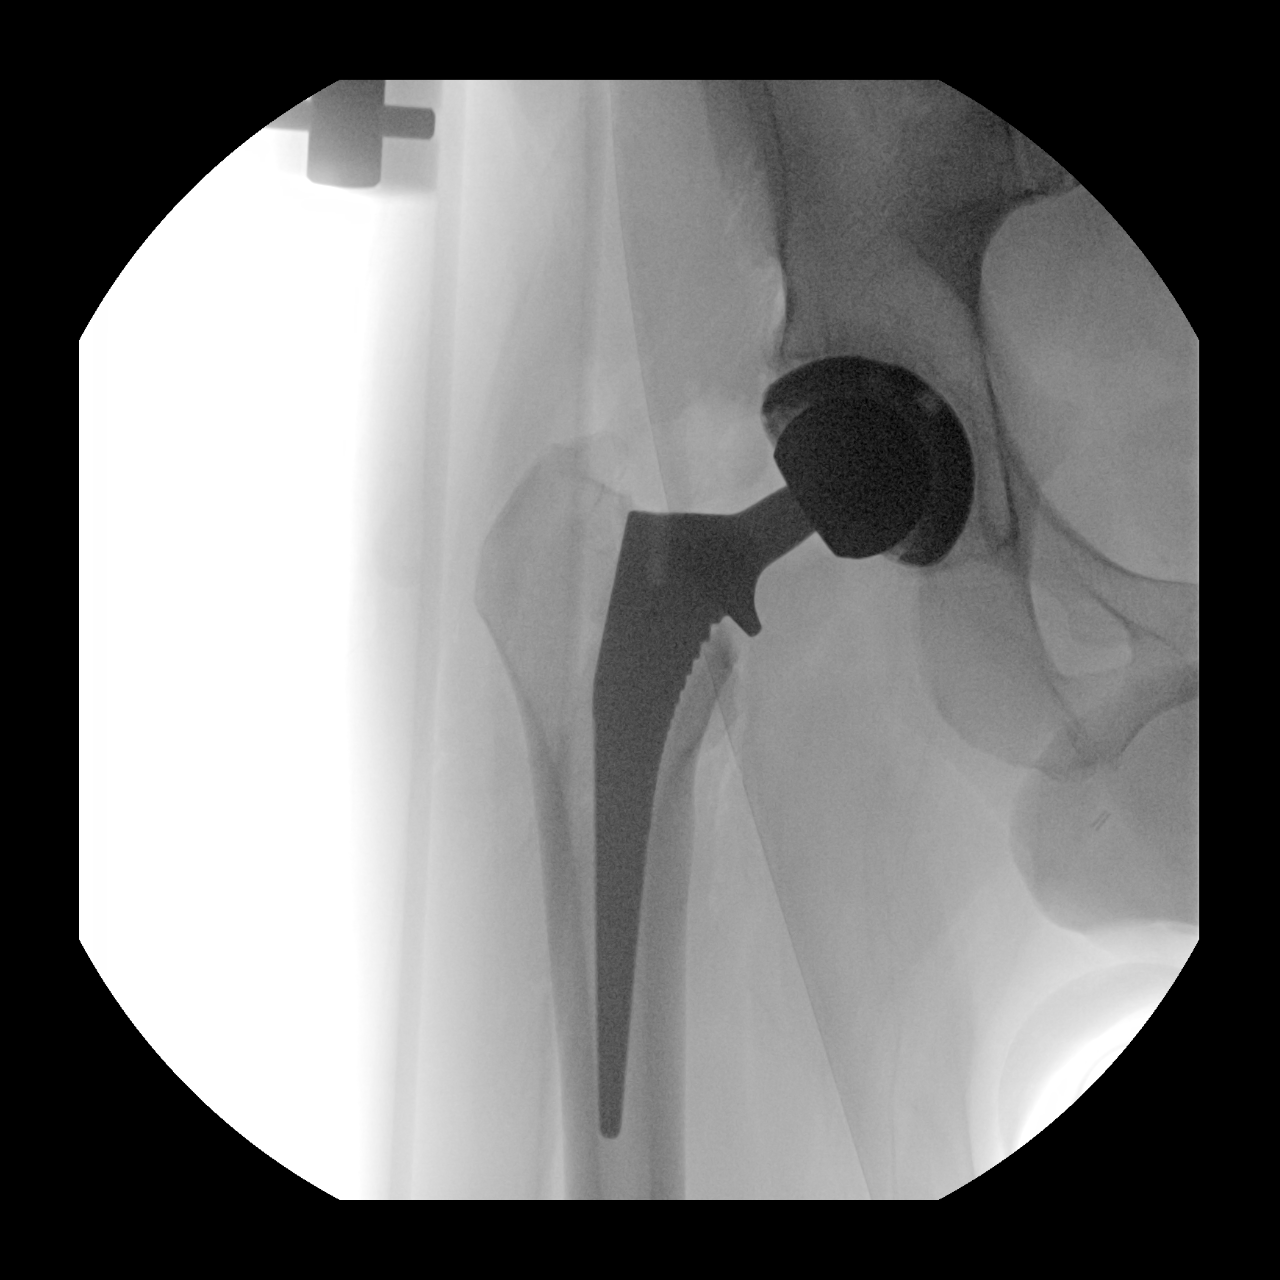
[im 2/3]
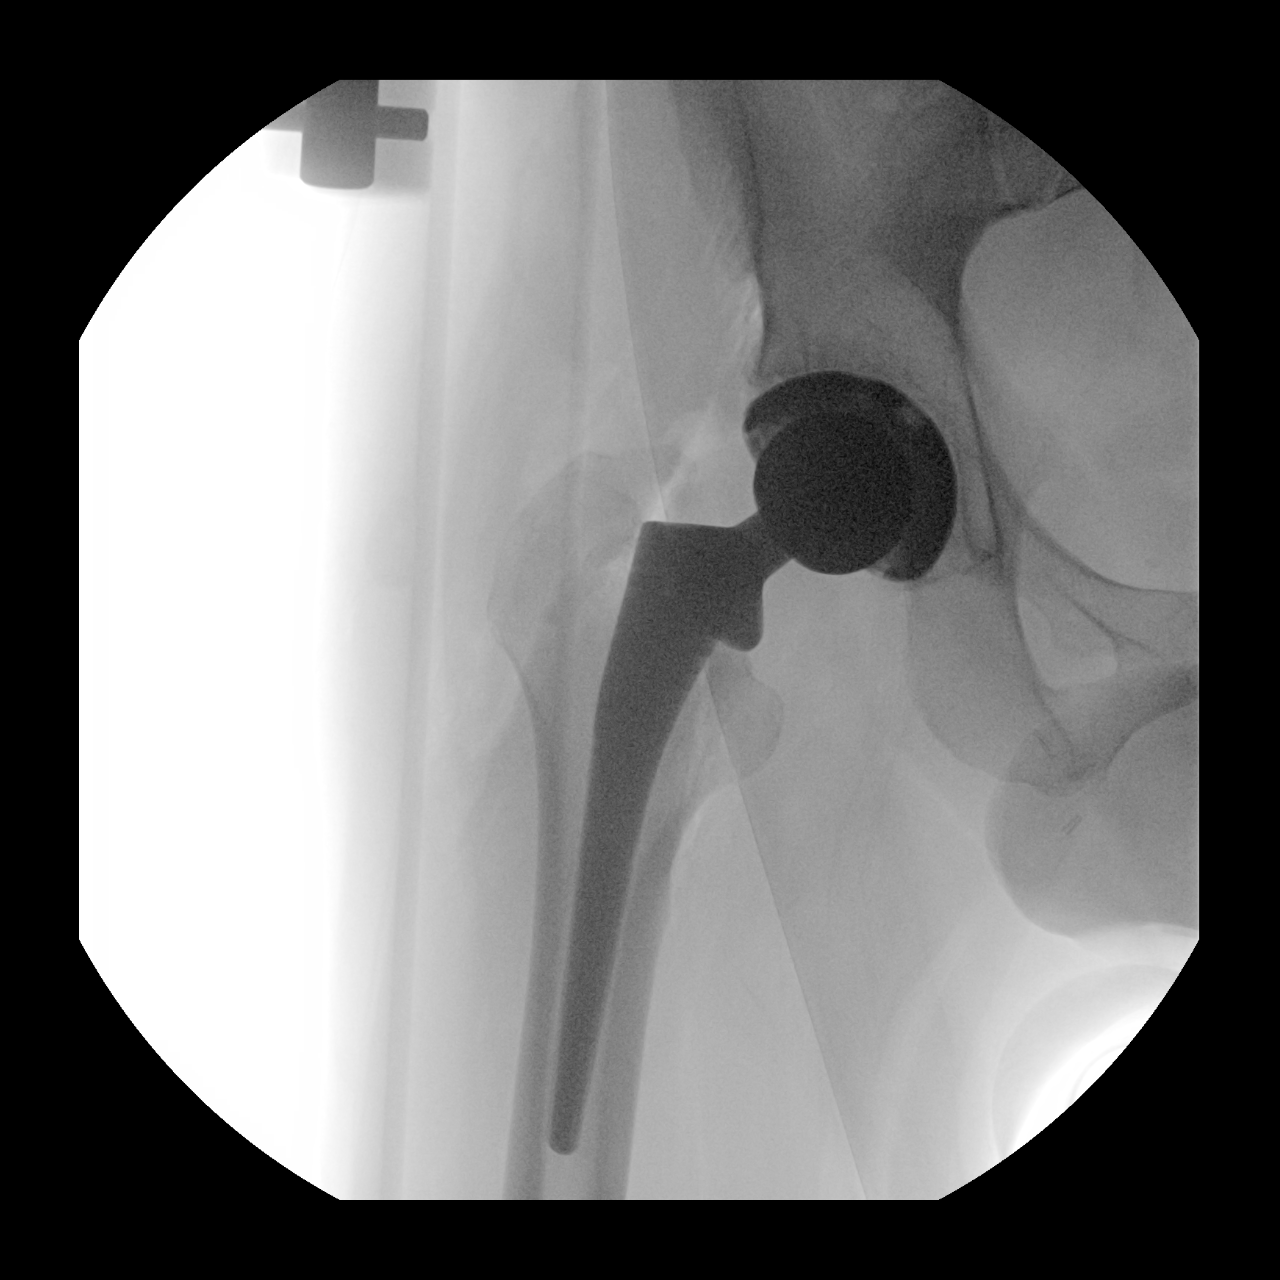
[im 3/3]
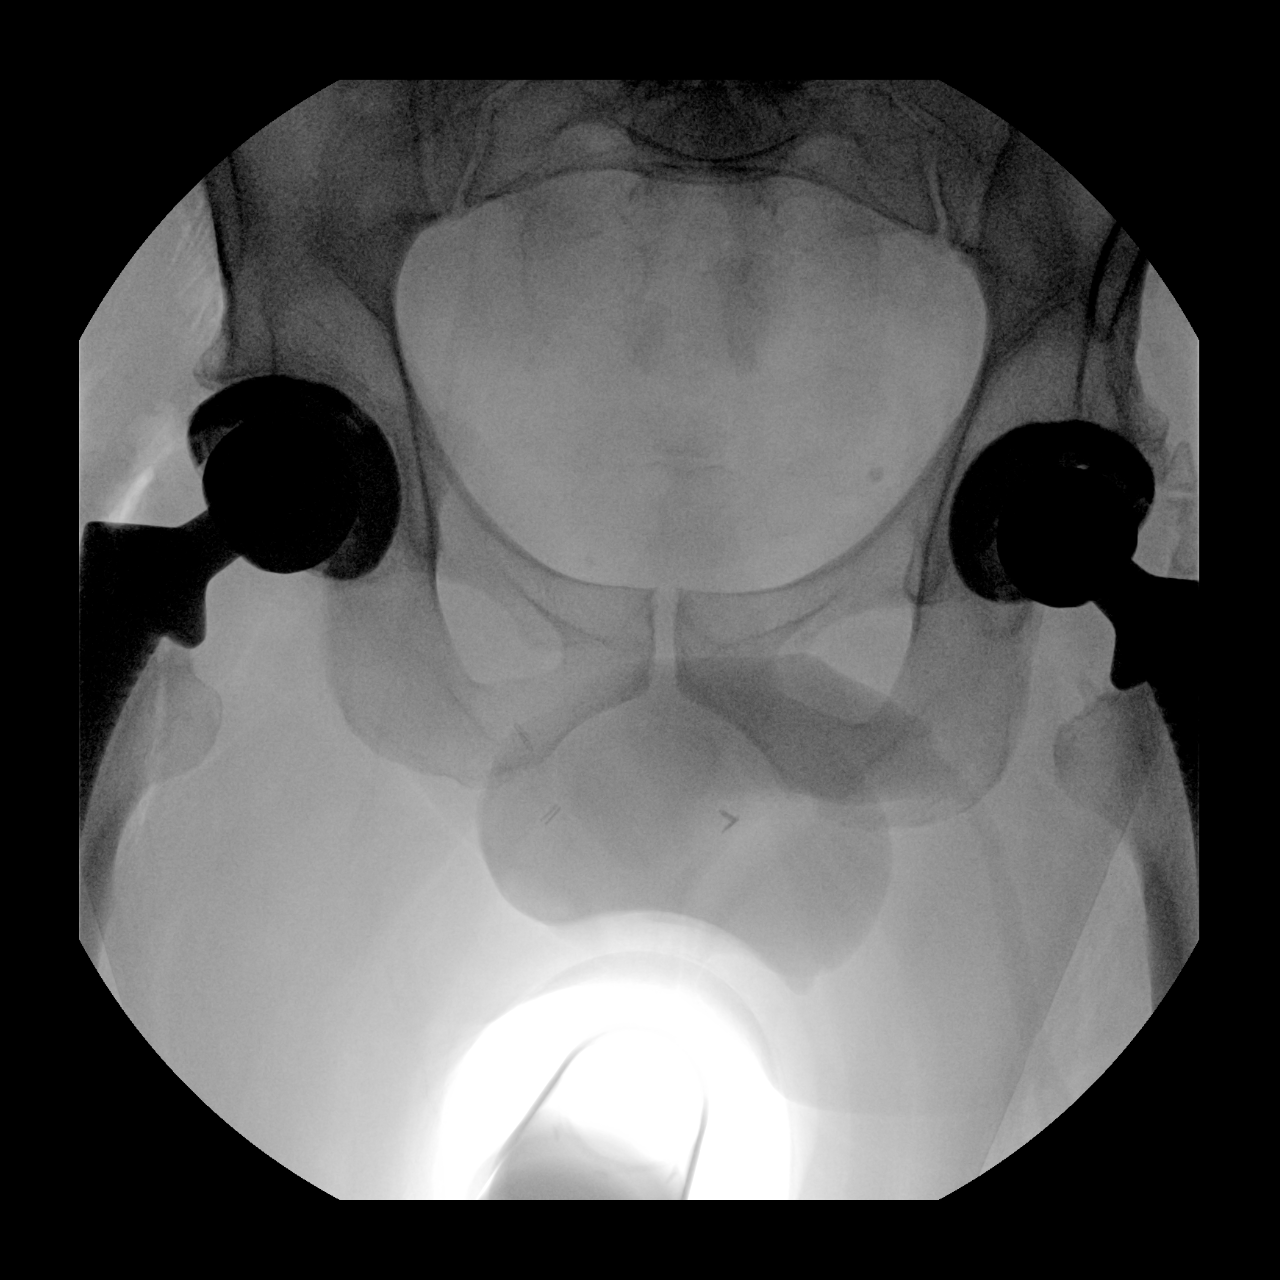

[3 of 3 positions shown; findings below may reference images not displayed]

FINDINGS: Total right hip replacement. Hardware intact. Anatomic alignment. No
acute bony abnormality.
IMPRESSION: Total right hip replacement with anatomic alignment.

## 2022-07-10 ENCOUNTER — Ambulatory Visit: Payer: 59 | Admitting: Physical Therapy

## 2022-07-10 ENCOUNTER — Encounter: Payer: Self-pay | Admitting: Physical Therapy

## 2022-07-10 DIAGNOSIS — M6281 Muscle weakness (generalized): Secondary | ICD-10-CM

## 2022-07-10 DIAGNOSIS — R2689 Other abnormalities of gait and mobility: Secondary | ICD-10-CM

## 2022-07-10 DIAGNOSIS — G8929 Other chronic pain: Secondary | ICD-10-CM

## 2022-07-10 DIAGNOSIS — M47816 Spondylosis without myelopathy or radiculopathy, lumbar region: Secondary | ICD-10-CM | POA: Diagnosis not present

## 2022-07-10 NOTE — Therapy (Signed)
OUTPATIENT PHYSICAL THERAPY THORACOLUMBAR EVALUATION   Patient Name: [name] MRN: [id number] DOB:June 30, 1966, 56 y.o., male Today's Date: 07/10/2022  END OF SESSION:  PT End of Session - 07/10/22 1147     Visit Number 3    Number of Visits 16    Date for PT Re-Evaluation 08/26/22    Authorization Type [employer]    PT Start Time 1147    PT Stop Time 1230    PT Time Calculation (min) 43 min    Activity Tolerance Patient tolerated treatment well    Behavior During Therapy WFL for tasks assessed/performed              Past Medical History:  Diagnosis Date   Anxiety    Arthritis    bilateral hips   Diabetes mellitus without complication (HCC)    type 2    PONV (postoperative nausea and vomiting)    Sleep apnea    Strep throat 09/17/11   on Zpac   Past Surgical History:  Procedure Laterality Date   HERNIA REPAIR     left inguinal    NECK SURGERY     TOTAL HIP ARTHROPLASTY  09/19/2011   Procedure: TOTAL HIP ARTHROPLASTY ANTERIOR APPROACH;  Surgeon: Kathryne Hitch, MD;  Location: [facility];  Service: Orthopedics;  Laterality: Left;   TOTAL HIP ARTHROPLASTY Right 06/29/2020   Procedure: RIGHT TOTAL HIP ARTHROPLASTY ANTERIOR APPROACH;  Surgeon: Kathryne Hitch, MD;  Location: [facility];  Service: Orthopedics;  Laterality: Right;   WISDOM TOOTH EXTRACTION     Patient Active Problem List   Diagnosis Date Noted   Status post total replacement of right hip 06/29/2020   Status post total hip replacement, right 06/29/2020   Unilateral primary osteoarthritis, right hip 06/28/2020   Snoring 11/01/2015   Hypersomnia 10/31/2015   Degenerative arthritis of hip 09/19/2011    PCP: Verl Bangs  REFERRING PROVIDER: Ellin Goodie  REFERRING DIAG:  M54.50,G89.29 (ICD-10-CM) - Chronic bilateral low back pain without sciatica  M47.816 (ICD-10-CM) - Spondylosis without myelopathy or radiculopathy, lumbar region  M47.816 (ICD-10-CM) - Facet hypertrophy of  lumbar region    Rationale for Evaluation and Treatment: Rehabilitation  THERAPY DIAG:  Chronic bilateral low back pain without sciatica  Muscle weakness (generalized)  Other abnormalities of gait and mobility  ONSET DATE: Chronic and ongoing for multiple years  SUBJECTIVE:                                                                                                                                                                                           SUBJECTIVE STATEMENT: Pt states he has not been able  to do his exercises -- drove to [place] to see the [employer]. Some continued residual back pain from lifting and standing the last few days.   PERTINENT HISTORY:  R & L THA  From eval: Pt reports chronic and ongoing back pain for many years. Pt states he has tried chiropractic care, acupuncture, and massage therapy. Massage therapy seems to help for a day or two but then after that it goes back to the same level of pain. Pain varies throughout the day -- most prevalent in the morning. Hurts to put on socks and shoes. Last bad flare up 2-3 months ago.   PAIN:  Are you having pain? Yes: NPRS scale: 2/10 Pain location: lower lumbar -- more on the left than right Pain description: at worst a little numbness in the L leg; discomfort Aggravating factors: Not sure what aggravates it-- but can feel it with bending; waiting in line/prolonged standing Relieving factors: massage therapy, heat  PRECAUTIONS: None  WEIGHT BEARING RESTRICTIONS: No  FALLS:  Has patient fallen in last 6 months? 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  PLOF: Independent  PATIENT GOALS: Improve mobility and pain/stiffness; return to hiking and biking  NEXT MD VISIT: n/a  OBJECTIVE:   MUSCLE LENGTH: Hamstrings: Right 90 deg; Left 90 deg Thomas test: Right 35 deg; Left 15 deg  POSTURE: decreased lumbar lordosis and decreased thoracic kyphosis  PALPATION: Mild tenderness to  palpation bilat QL into superior ilium and upper glutes, tone notable in bilat paraspinals L>R  LOWER EXTREMITY MMT:    MMT Right eval Left eval  Hip flexion 5 5  Hip extension 3+ 3+  Hip abduction 3+ 3+  Hip adduction 3+ 3+  Hip internal rotation 4 4  Hip external rotation 4 4  Knee flexion 4+ 4+  Knee extension 4+ 4+  Ankle dorsiflexion    Ankle plantarflexion    Ankle inversion    Ankle eversion     (Blank rows = not tested)  LUMBAR SPECIAL TESTS:  Straight leg raise test: Negative, Slump test: Negative, FABER test: Negative, and Thomas test: Positive, Knee bend (+) bilat (on L can feel on right side of back as well)  [facility] Adult PT Treatment:                                                DATE: 07/10/22 Therapeutic Exercise: Treadmill 1.7 mph x 5 min Supine Hip flexor stretch 2x30 sec Quad stretch in modified thomas with strap 2x30 sec Ab set + double knee bend 2x10 Bridge 2x10x3 sec Quadruped Bird dog 2x10 Plank 2x20 sec forearms/feet Standing Side step green TB 2x15' Wall squat with PPT 2x10 Palloff press green TB 2x10x3 sec   [facility] Adult PT Treatment:                                                DATE: 07/08/22 Therapeutic Exercise: Treadmill 1.7 mph x 5 min Supine Hip flexor stretch 2x30 sec Hip flexor contract/relax 5x5 sec Ab set + double knee bend 2x10 LTR 3x10 sec Prone Hip ext x10, with green TB x10 Sidelying Hip abd green TB 2x10 Hip add 2x10 Standing Hip abd green TB 2x10 cues to keep  from using back Hip ext green TB 2x10 cues to keep from using back Palloff press green TB 2x10x3 sec Manual Therapy: STM & TPR psoas major and QL    PATIENT EDUCATION:  Education details: Exam findings, POC, HEP Person educated: Patient Education method: Explanation, Demonstration, and Handouts Education comprehension: verbalized understanding, returned demonstration, and needs further education  HOME EXERCISE PROGRAM: Access Code: [email or web address] URL:  [email or web address] Date: 07/10/2022 Prepared by: Vernon PreyGellen April Kirstie PeriMarie Marliyah Reid  Exercises - Hip Abduction with Resistance Loop  - 1 x daily - 7 x weekly - 2 sets - 10 reps - Hip Extension with Resistance Loop  - 1 x daily - 7 x weekly - 2 sets - 10 reps - Sidelying ITB Stretch off Table  - 1 x daily - 7 x weekly - 2 sets - 30 sec hold - Supine Lower Trunk Rotation  - 1 x daily - 7 x weekly - 3 sets - 10 sec hold - Bilateral Bent Leg Lift  - 1 x daily - 7 x weekly - 2 sets - 10 reps - Standing Anti-Rotation Press with Anchored Resistance  - 1 x daily - 7 x weekly - 2 sets - 10 reps - 3 sec hold  ASSESSMENT:  CLINICAL IMPRESSION: Treatment focused on progressing core and hip strengthening. Working on increasing core endurance. Did not change HEP at this time due to pt not having had time to try new exercises. Will initiate hip hinge and functional lifting next session as pt tolerates.   OBJECTIVE IMPAIRMENTS: decreased endurance, decreased mobility, decreased strength, increased fascial restrictions, increased muscle spasms, improper body mechanics, postural dysfunction, and pain.   ACTIVITY LIMITATIONS: lifting, bending, and standing  PARTICIPATION LIMITATIONS: cleaning, shopping, occupation, and yard work  PERSONAL FACTORS: Age, Fitness, Past/current experiences, Profession, and Time since onset of injury/illness/exacerbation are also affecting patient's functional outcome.   REHAB POTENTIAL: Good  CLINICAL DECISION MAKING: Evolving/moderate complexity  EVALUATION COMPLEXITY: Moderate   GOALS: Goals reviewed with patient? Yes  SHORT TERM GOALS: Target date: 07/29/2022   Pt will be ind with initial HEP Baseline: Goal status: INITIAL  2.  Pt will report >/=25% improvement with bending movements Baseline:  Goal status: INITIAL    LONG TERM GOALS: Target date: 08/26/2022   Pt will be ind with continuing and progressing HEP Baseline:  Goal status:  INITIAL  2.  Pt will be able to demonstrate good body mechanics for back safety with all lifting tasks Baseline:  Goal status: INITIAL  3.  Pt will be able to lift at least 25# with no pain to demo improved functional LE strength Baseline:  Goal status: INITIAL  4.  Pt will be able to get bilat thighs flat on mat table during Thomas stretch to demo improved hip flexor length Baseline:  Goal status: INITIAL  5.  Pt will be able to stand at least 20 min with >/=50% improvement in back pain for shopping Baseline:  Goal status: INITIAL  6.  Pt will have improved FOTO score to >/=61 Baseline:  Goal status: INITIAL  PLAN:  PT FREQUENCY: 2x/week  PT DURATION: 8 weeks  PLANNED INTERVENTIONS: Therapeutic exercises, Therapeutic activity, Neuromuscular re-education, Balance training, Gait training, Patient/Family education, Self Care, Joint mobilization, Stair training, Aquatic Therapy, Dry Needling, Electrical stimulation, Spinal mobilization, Cryotherapy, Moist heat, Taping, Ultrasound, Ionotophoresis 4mg /ml Dexamethasone, and Manual therapy.  PLAN FOR NEXT SESSION: Assess response to HEP. Manual work as indicated for lumbar/sacrum/hip flexors. Continue to stretch psoas/quads/hip flexors. Strengthen bilat hips  and initiate core strengthening.    Sufian Ravi April Ma L Arlie Riker, PT 07/10/2022, 11:47 AM

## 2022-07-16 ENCOUNTER — Ambulatory Visit: Payer: 59 | Admitting: Physical Therapy

## 2022-07-16 ENCOUNTER — Encounter: Payer: Self-pay | Admitting: Physical Therapy

## 2022-07-16 DIAGNOSIS — R2689 Other abnormalities of gait and mobility: Secondary | ICD-10-CM

## 2022-07-16 DIAGNOSIS — M47816 Spondylosis without myelopathy or radiculopathy, lumbar region: Secondary | ICD-10-CM | POA: Diagnosis not present

## 2022-07-16 DIAGNOSIS — M545 Low back pain, unspecified: Secondary | ICD-10-CM

## 2022-07-16 DIAGNOSIS — M6281 Muscle weakness (generalized): Secondary | ICD-10-CM

## 2022-07-16 NOTE — Therapy (Signed)
OUTPATIENT PHYSICAL THERAPY TREATMENT   Patient Name: [name] MRN: [id number] DOB:Jun 26, 1966, 56 y.o., male Today's Date: 07/16/2022  END OF SESSION:  PT End of Session - 07/16/22 1102     Visit Number 4    Number of Visits 16    Date for PT Re-Evaluation 08/26/22    Authorization Type UHC    PT Start Time 1102    PT Stop Time 1145    PT Time Calculation (min) 43 min    Activity Tolerance Patient tolerated treatment well    Behavior During Therapy WFL for tasks assessed/performed               Past Medical History:  Diagnosis Date   Anxiety    Arthritis    bilateral hips   Diabetes mellitus without complication (HCC)    type 2    PONV (postoperative nausea and vomiting)    Sleep apnea    Strep throat 09/17/11   on Zpac   Past Surgical History:  Procedure Laterality Date   HERNIA REPAIR     left inguinal    NECK SURGERY     TOTAL HIP ARTHROPLASTY  09/19/2011   Procedure: TOTAL HIP ARTHROPLASTY ANTERIOR APPROACH;  Surgeon: Kathryne Hitch, MD;  Location: [facility];  Service: Orthopedics;  Laterality: Left;   TOTAL HIP ARTHROPLASTY Right 06/29/2020   Procedure: RIGHT TOTAL HIP ARTHROPLASTY ANTERIOR APPROACH;  Surgeon: Kathryne Hitch, MD;  Location: [facility];  Service: Orthopedics;  Laterality: Right;   WISDOM TOOTH EXTRACTION     Patient Active Problem List   Diagnosis Date Noted   Status post total replacement of right hip 06/29/2020   Status post total hip replacement, right 06/29/2020   Unilateral primary osteoarthritis, right hip 06/28/2020   Snoring 11/01/2015   Hypersomnia 10/31/2015   Degenerative arthritis of hip 09/19/2011    PCP: Verl Bangs  REFERRING PROVIDER: Ellin Goodie  REFERRING DIAG:  M54.50,G89.29 (ICD-10-CM) - Chronic bilateral low back pain without sciatica  M47.816 (ICD-10-CM) - Spondylosis without myelopathy or radiculopathy, lumbar region  M47.816 (ICD-10-CM) - Facet hypertrophy of lumbar region     Rationale for Evaluation and Treatment: Rehabilitation  THERAPY DIAG:  Chronic bilateral low back pain without sciatica  Muscle weakness (generalized)  Other abnormalities of gait and mobility  ONSET DATE: Chronic and ongoing for multiple years  SUBJECTIVE:                                                                                                                                                                                           SUBJECTIVE STATEMENT: Pt reports he has finally moved into  his new office. Pt reports he got his massage done for his hip and back. Notes in the last couple of days he has been getting a lot of lower back pain. Pt notes his ankle is hurting on the R. Has continued to do his stretches. Back pain was a 4-5/10 earlier this morning.   PERTINENT HISTORY:  R & L THA  From eval: Pt reports chronic and ongoing back pain for many years. Pt states he has tried chiropractic care, acupuncture, and massage therapy. Massage therapy seems to help for a day or two but then after that it goes back to the same level of pain. Pain varies throughout the day -- most prevalent in the morning. Hurts to put on socks and shoes. Last bad flare up 2-3 months ago.   PAIN:  Are you having pain? Yes: NPRS scale: 2-3/10 Pain location: lower lumbar -- more on the left than right Pain description: at worst a little numbness in the L leg; discomfort Aggravating factors: Not sure what aggravates it-- but can feel it with bending; waiting in line/prolonged standing Relieving factors: massage therapy, heat  PRECAUTIONS: None  WEIGHT BEARING RESTRICTIONS: No  FALLS:  Has patient fallen in last 6 months? 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  PATIENT GOALS: Improve mobility and pain/stiffness; return to hiking and biking  NEXT MD VISIT: n/a  OBJECTIVE:   MUSCLE LENGTH: Hamstrings: Right 90 deg; Left 90 deg Thomas test: Right 35 deg; Left 15  deg  POSTURE: decreased lumbar lordosis and decreased thoracic kyphosis  PALPATION: Mild tenderness to palpation bilat QL into superior ilium and upper glutes, tone notable in bilat paraspinals L>R  LOWER EXTREMITY MMT:    MMT Right eval Left eval  Hip flexion 5 5  Hip extension 3+ 3+  Hip abduction 3+ 3+  Hip adduction 3+ 3+  Hip internal rotation 4 4  Hip external rotation 4 4  Knee flexion 4+ 4+  Knee extension 4+ 4+  Ankle dorsiflexion    Ankle plantarflexion    Ankle inversion    Ankle eversion     (Blank rows = not tested)  LUMBAR SPECIAL TESTS:  Straight leg raise test: Negative, Slump test: Negative, FABER test: Negative, and Thomas test: Positive, Knee bend (+) bilat (on L can feel on right side of back as well)  07/16/22: knee bend (-) bilat no pull into low back  [facility] Adult PT Treatment:                                                DATE: 07/16/22 Therapeutic Exercise: Treadmill 1.7 mph x 5 min Supine Hip flexor stretch 2x30 sec DKTC with LTR x30 sec (feels pull in side of hip on R) Ab set + bilat bent leg lift x10 Ab set + bilat bent leg into knee ext x10 Half kneeling Hip flexor stretch with outstretched arm 2x30 sec Quadruped Bird dog x10x3 sec Standing Deadlift 10# KB 2x10 Side step green TB 2x10    [facility] Adult PT Treatment:                                                DATE: 07/10/22 Therapeutic Exercise: Treadmill 1.7 mph  x 5 min Supine Hip flexor stretch 2x30 sec Quad stretch in modified thomas with strap 2x30 sec Ab set + double knee bend 2x10 Bridge 2x10x3 sec Quadruped Bird dog 2x10 Plank 2x20 sec forearms/feet Standing Side step green TB 2x15' Wall squat with PPT 2x10 Palloff press green TB 2x10x3 sec   [facility] Adult PT Treatment:                                                DATE: 07/08/22 Therapeutic Exercise: Treadmill 1.7 mph x 5 min Supine Hip flexor stretch 2x30 sec Hip flexor contract/relax 5x5 sec Ab set + double  knee bend 2x10 LTR 3x10 sec Prone Hip ext x10, with green TB x10 Sidelying Hip abd green TB 2x10 Hip add 2x10 Standing Hip abd green TB 2x10 cues to keep from using back Hip ext green TB 2x10 cues to keep from using back Palloff press green TB 2x10x3 sec Manual Therapy: STM & TPR psoas major and QL    PATIENT EDUCATION:  Education details: Exam findings, POC, HEP Person educated: Patient Education method: Explanation, Demonstration, and Handouts Education comprehension: verbalized understanding, returned demonstration, and needs further education  HOME EXERCISE PROGRAM: Access Code: [email or web address] URL: [email or web address] Date: 07/10/2022 Prepared by: Vernon Prey April Kirstie Peri  Exercises - Hip Abduction with Resistance Loop  - 1 x daily - 7 x weekly - 2 sets - 10 reps - Hip Extension with Resistance Loop  - 1 x daily - 7 x weekly - 2 sets - 10 reps - Sidelying ITB Stretch off Table  - 1 x daily - 7 x weekly - 2 sets - 30 sec hold - Supine Lower Trunk Rotation  - 1 x daily - 7 x weekly - 3 sets - 10 sec hold - Bilateral Bent Leg Lift  - 1 x daily - 7 x weekly - 2 sets - 10 reps - Standing Anti-Rotation Press with Anchored Resistance  - 1 x daily - 7 x weekly - 2 sets - 10 reps - 3 sec hold  ASSESSMENT:  CLINICAL IMPRESSION: Attempted wall squats with PPT this session; however, pt with increased R knee pain. Session continues to focus on improving core and hip strength. Less L side back pain per pt report -- more centered along lumbosacral area. Improving L hip flexor length.   OBJECTIVE IMPAIRMENTS: decreased endurance, decreased mobility, decreased strength, increased fascial restrictions, increased muscle spasms, improper body mechanics, postural dysfunction, and pain.   ACTIVITY LIMITATIONS: lifting, bending, and standing  PARTICIPATION LIMITATIONS: cleaning, shopping, occupation, and yard work  PERSONAL FACTORS: Age, Fitness, Past/current experiences,  Profession, and Time since onset of injury/illness/exacerbation are also affecting patient's functional outcome.   REHAB POTENTIAL: Good  CLINICAL DECISION MAKING: Evolving/moderate complexity  EVALUATION COMPLEXITY: Moderate   GOALS: Goals reviewed with patient? Yes  SHORT TERM GOALS: Target date: 07/29/2022   Pt will be ind with initial HEP Baseline: Goal status: INITIAL  2.  Pt will report >/=25% improvement with bending movements Baseline:  Goal status: INITIAL    LONG TERM GOALS: Target date: 08/26/2022   Pt will be ind with continuing and progressing HEP Baseline:  Goal status: INITIAL  2.  Pt will be able to demonstrate good body mechanics for back safety with all lifting tasks Baseline:  Goal status: INITIAL  3.  Pt will be able to lift  at least 25# with no pain to demo improved functional LE strength Baseline:  Goal status: INITIAL  4.  Pt will be able to get bilat thighs flat on mat table during Thomas stretch to demo improved hip flexor length Baseline:  Goal status: INITIAL  5.  Pt will be able to stand at least 20 min with >/=50% improvement in back pain for shopping Baseline:  Goal status: INITIAL  6.  Pt will have improved FOTO score to >/=61 Baseline:  Goal status: INITIAL  PLAN:  PT FREQUENCY: 2x/week  PT DURATION: 8 weeks  PLANNED INTERVENTIONS: Therapeutic exercises, Therapeutic activity, Neuromuscular re-education, Balance training, Gait training, Patient/Family education, Self Care, Joint mobilization, Stair training, Aquatic Therapy, Dry Needling, Electrical stimulation, Spinal mobilization, Cryotherapy, Moist heat, Taping, Ultrasound, Ionotophoresis 4mg /ml Dexamethasone, and Manual therapy.  PLAN FOR NEXT SESSION: Assess response to HEP. Manual work as indicated for lumbar/sacrum/hip flexors. Continue to stretch psoas/quads/hip flexors. Strengthen bilat hips and initiate core strengthening.    Amjad Fikes April Ma L Kable Haywood, PT 07/16/2022,  11:03 AM

## 2022-07-22 ENCOUNTER — Ambulatory Visit: Payer: 59 | Attending: Physical Medicine and Rehabilitation | Admitting: Physical Therapy

## 2022-07-22 ENCOUNTER — Encounter: Payer: Self-pay | Admitting: Physical Therapy

## 2022-07-22 DIAGNOSIS — M6281 Muscle weakness (generalized): Secondary | ICD-10-CM | POA: Diagnosis present

## 2022-07-22 DIAGNOSIS — G8929 Other chronic pain: Secondary | ICD-10-CM | POA: Diagnosis present

## 2022-07-22 DIAGNOSIS — M47816 Spondylosis without myelopathy or radiculopathy, lumbar region: Secondary | ICD-10-CM | POA: Diagnosis present

## 2022-07-22 DIAGNOSIS — R2689 Other abnormalities of gait and mobility: Secondary | ICD-10-CM | POA: Diagnosis present

## 2022-07-22 DIAGNOSIS — M545 Low back pain, unspecified: Secondary | ICD-10-CM | POA: Diagnosis present

## 2022-07-22 NOTE — Therapy (Signed)
OUTPATIENT PHYSICAL THERAPY TREATMENT   Patient Name: [name] MRN: [id number] DOB:Dec 26, 1965, 57 y.o., male Today's Date: 07/22/2022  END OF SESSION:  PT End of Session - 07/22/22 1220     Visit Number 5    Number of Visits 16    Date for PT Re-Evaluation 08/26/22    PT Start Time 5956    PT Stop Time 1220    PT Time Calculation (min) 38 min    Activity Tolerance Patient tolerated treatment well    Behavior During Therapy [facility] for tasks assessed/performed                Past Medical History:  Diagnosis Date   Anxiety    Arthritis    bilateral hips   Diabetes mellitus without complication (HCC)    type 2    PONV (postoperative nausea and vomiting)    Sleep apnea    Strep throat 09/17/11   on Zpac   Past Surgical History:  Procedure Laterality Date   HERNIA REPAIR     left inguinal    NECK SURGERY     TOTAL HIP ARTHROPLASTY  09/19/2011   Procedure: TOTAL HIP ARTHROPLASTY ANTERIOR APPROACH;  Surgeon: Mcarthur Rossetti, MD;  Location: [facility];  Service: Orthopedics;  Laterality: Left;   TOTAL HIP ARTHROPLASTY Right 06/29/2020   Procedure: RIGHT TOTAL HIP ARTHROPLASTY ANTERIOR APPROACH;  Surgeon: Mcarthur Rossetti, MD;  Location: [facility];  Service: Orthopedics;  Laterality: Right;   WISDOM TOOTH EXTRACTION     Patient Active Problem List   Diagnosis Date Noted   Status post total replacement of right hip 06/29/2020   Status post total hip replacement, right 06/29/2020   Unilateral primary osteoarthritis, right hip 06/28/2020   Snoring 11/01/2015   Hypersomnia 10/31/2015   Degenerative arthritis of hip 09/19/2011    PCP: Jamesetta Geralds  REFERRING PROVIDER: Barnet Pall  REFERRING DIAG:  M54.50,G89.29 (ICD-10-CM) - Chronic bilateral low back pain without sciatica  M47.816 (ICD-10-CM) - Spondylosis without myelopathy or radiculopathy, lumbar region  M47.816 (ICD-10-CM) - Facet hypertrophy of lumbar region    Rationale for  Evaluation and Treatment: Rehabilitation  THERAPY DIAG:  Chronic bilateral low back pain without sciatica  Muscle weakness (generalized)  Other abnormalities of gait and mobility  ONSET DATE: Chronic and ongoing for multiple years  SUBJECTIVE:                                                                                                                                                                                           SUBJECTIVE STATEMENT: Pt reports his back hurts worse in the mornings but seems to "  loosen up" as the day goes on. He states his back has been "sore" but no real "pain".  PERTINENT HISTORY:  R & L THA  From eval: Pt reports chronic and ongoing back pain for many years. Pt states he has tried chiropractic care, acupuncture, and massage therapy. Massage therapy seems to help for a day or two but then after that it goes back to the same level of pain. Pain varies throughout the day -- most prevalent in the morning. Hurts to put on socks and shoes. Last bad flare up 2-3 months ago.   PAIN:  Are you having pain? Yes: NPRS scale: 2/10 Pain location: lower lumbar -- more on the left than right Pain description: at worst a little numbness in the L leg; discomfort Aggravating factors: Not sure what aggravates it-- but can feel it with bending; waiting in line/prolonged standing Relieving factors: massage therapy, heat  PRECAUTIONS: None  WEIGHT BEARING RESTRICTIONS: No  FALLS:  Has patient fallen in last 6 months? 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  PATIENT GOALS: Improve mobility and pain/stiffness; return to hiking and biking  NEXT MD VISIT: n/a  OBJECTIVE:   MUSCLE LENGTH: Hamstrings: Right 90 deg; Left 90 deg Thomas test: Right 35 deg; Left 15 deg  LOWER EXTREMITY MMT:    MMT Right eval Left eval  Hip flexion 5 5  Hip extension 3+ 3+  Hip abduction 3+ 3+  Hip adduction 3+ 3+  Hip internal rotation 4 4  Hip external rotation 4  4  Knee flexion 4+ 4+  Knee extension 4+ 4+  Ankle dorsiflexion    Ankle plantarflexion    Ankle inversion    Ankle eversion     (Blank rows = not tested)  LUMBAR SPECIAL TESTS:  Straight leg raise test: Negative, Slump test: Negative, FABER test: Negative, and Thomas test: Positive, Knee bend (+) bilat (on L can feel on right side of back as well)  07/16/22: knee bend (-) bilat no pull into low back  [facility] Adult PT Treatment:                                                DATE: 07/22/22 Therapeutic Exercise: Treadmill x 5 min 1.9 mph Hip flexor stretch 2 x 30 seconds Bridge with LEs on physioball Ab set with marching x 10 Ab set with double LE lift x 10 Bird dog 2 x 10 Deadlift 15# 2 x 10 Sidestep green TB 20'x 4 Pallof 10# 2 x 10 bilat Backward walking 15# x 10 Half kneeling hip flexor stretch 2 x 30 sec   [facility] Adult PT Treatment:                                                DATE: 07/16/22 Therapeutic Exercise: Treadmill 1.7 mph x 5 min Supine Hip flexor stretch 2x30 sec DKTC with LTR x30 sec (feels pull in side of hip on R) Ab set + bilat bent leg lift x10 Ab set + bilat bent leg into knee ext x10 Half kneeling Hip flexor stretch with outstretched arm 2x30 sec Quadruped Bird dog x10x3 sec Standing Deadlift 10# KB 2x10 Side step green TB 2x10    PATIENT  EDUCATION:  Education details: Exam findings, POC, HEP Person educated: Patient Education method: Explanation, Demonstration, and Handouts Education comprehension: verbalized understanding, returned demonstration, and needs further education  HOME EXERCISE PROGRAM: Access Code: [email or web address] URL: [email or web address] Date: 07/10/2022 Prepared by: Vernon Prey April Kirstie Peri  Exercises - Hip Abduction with Resistance Loop  - 1 x daily - 7 x weekly - 2 sets - 10 reps - Hip Extension with Resistance Loop  - 1 x daily - 7 x weekly - 2 sets - 10 reps - Sidelying ITB Stretch off Table  - 1 x daily - 7 x  weekly - 2 sets - 30 sec hold - Supine Lower Trunk Rotation  - 1 x daily - 7 x weekly - 3 sets - 10 sec hold - Bilateral Bent Leg Lift  - 1 x daily - 7 x weekly - 2 sets - 10 reps - Standing Anti-Rotation Press with Anchored Resistance  - 1 x daily - 7 x weekly - 2 sets - 10 reps - 3 sec hold  ASSESSMENT:  CLINICAL IMPRESSION: Pt with good tolerance to additional glute and core strengthening with no increase in pain. Limited in ability to do squats due to knee pain. He continues to slowly improve flexibility and standing tolerance   GOALS: Goals reviewed with patient? Yes  SHORT TERM GOALS: Target date: 07/29/2022   Pt will be ind with initial HEP Baseline: Goal status: INITIAL  2.  Pt will report >/=25% improvement with bending movements Baseline:  Goal status: INITIAL    LONG TERM GOALS: Target date: 08/26/2022   Pt will be ind with continuing and progressing HEP Baseline:  Goal status: INITIAL  2.  Pt will be able to demonstrate good body mechanics for back safety with all lifting tasks Baseline:  Goal status: INITIAL  3.  Pt will be able to lift at least 25# with no pain to demo improved functional LE strength Baseline:  Goal status: INITIAL  4.  Pt will be able to get bilat thighs flat on mat table during Thomas stretch to demo improved hip flexor length Baseline:  Goal status: INITIAL  5.  Pt will be able to stand at least 20 min with >/=50% improvement in back pain for shopping Baseline:  Goal status: INITIAL  6.  Pt will have improved FOTO score to >/=61 Baseline:  Goal status: INITIAL  PLAN:  PT FREQUENCY: 2x/week  PT DURATION: 8 weeks  PLANNED INTERVENTIONS: Therapeutic exercises, Therapeutic activity, Neuromuscular re-education, Balance training, Gait training, Patient/Family education, Self Care, Joint mobilization, Stair training, Aquatic Therapy, Dry Needling, Electrical stimulation, Spinal mobilization, Cryotherapy, Moist heat, Taping,  Ultrasound, Ionotophoresis 4mg /ml Dexamethasone, and Manual therapy.  PLAN FOR NEXT SESSION: Glute and core strength! Assess response to HEP. Manual work as indicated for lumbar/sacrum/hip flexors. Continue to stretch psoas/quads/hip flexors.   Matthias Bogus, PT 07/22/2022, 12:20 PM

## 2022-07-25 ENCOUNTER — Ambulatory Visit: Payer: 59 | Admitting: Physical Therapy

## 2022-08-05 ENCOUNTER — Encounter: Payer: Self-pay | Admitting: Physical Therapy

## 2022-08-05 ENCOUNTER — Ambulatory Visit: Payer: 59 | Admitting: Physical Therapy

## 2022-08-05 DIAGNOSIS — M545 Low back pain, unspecified: Secondary | ICD-10-CM | POA: Diagnosis not present

## 2022-08-05 DIAGNOSIS — M6281 Muscle weakness (generalized): Secondary | ICD-10-CM

## 2022-08-05 DIAGNOSIS — R2689 Other abnormalities of gait and mobility: Secondary | ICD-10-CM

## 2022-08-05 DIAGNOSIS — G8929 Other chronic pain: Secondary | ICD-10-CM

## 2022-08-05 DIAGNOSIS — M47816 Spondylosis without myelopathy or radiculopathy, lumbar region: Secondary | ICD-10-CM

## 2022-08-05 NOTE — Therapy (Addendum)
OUTPATIENT PHYSICAL THERAPY TREATMENT AND DISCHARGE  PHYSICAL THERAPY DISCHARGE SUMMARY  Visits from Start of Care: 6  Current functional level related to goals / functional outcomes: See below   Remaining deficits: See below   Education / Equipment: See below   Patient agrees to discharge. Patient goals were partially met. Patient is being discharged due to not returning since the last visit.   Patient Name: Hilman Kissling MRN: [id number] DOB:05/09/1966, 57 y.o., male Today's Date: 08/05/2022  END OF SESSION:  PT End of Session - 08/05/22 1143     Visit Number 6    Number of Visits 16    Date for PT Re-Evaluation 08/26/22    Authorization Type [employer]    PT Start Time 1145    PT Stop Time 1230    PT Time Calculation (min) 45 min    Activity Tolerance Patient tolerated treatment well    Behavior During Therapy WFL for tasks assessed/performed             Past Medical History:  Diagnosis Date   Anxiety    Arthritis    bilateral hips   Diabetes mellitus without complication (HCC)    type 2    PONV (postoperative nausea and vomiting)    Sleep apnea    Strep throat 09/17/11   on Zpac   Past Surgical History:  Procedure Laterality Date   HERNIA REPAIR     left inguinal    NECK SURGERY     TOTAL HIP ARTHROPLASTY  09/19/2011   Procedure: TOTAL HIP ARTHROPLASTY ANTERIOR APPROACH;  Surgeon: Mcarthur Rossetti, MD;  Location: [facility];  Service: Orthopedics;  Laterality: Left;   TOTAL HIP ARTHROPLASTY Right 06/29/2020   Procedure: RIGHT TOTAL HIP ARTHROPLASTY ANTERIOR APPROACH;  Surgeon: Mcarthur Rossetti, MD;  Location: [facility];  Service: Orthopedics;  Laterality: Right;   WISDOM TOOTH EXTRACTION     Patient Active Problem List   Diagnosis Date Noted   Status post total replacement of right hip 06/29/2020   Status post total hip replacement, right 06/29/2020   Unilateral primary osteoarthritis, right hip 06/28/2020   Snoring 11/01/2015   Hypersomnia  10/31/2015   Degenerative arthritis of hip 09/19/2011    PCP: Jamesetta Geralds  REFERRING PROVIDER: Barnet Pall  REFERRING DIAG:  M54.50,G89.29 (ICD-10-CM) - Chronic bilateral low back pain without sciatica  M47.816 (ICD-10-CM) - Spondylosis without myelopathy or radiculopathy, lumbar region  M47.816 (ICD-10-CM) - Facet hypertrophy of lumbar region    Rationale for Evaluation and Treatment: Rehabilitation  THERAPY DIAG:  Chronic bilateral low back pain without sciatica  Muscle weakness (generalized)  Other abnormalities of gait and mobility  Spondylosis without myelopathy or radiculopathy, lumbar region  Facet hypertrophy of lumbar region  ONSET DATE: Chronic and ongoing for multiple years  SUBJECTIVE:  SUBJECTIVE STATEMENT: Pt states his back spasmed on him while lifting his leg out of the shower and worsened with bending over to put on his sock. Can still feel it somewhat. Has been able to return to some exercises. Prior to spasm had noticed increased tolerance to standing >5 min and tolerating increased walking with his wife.   PERTINENT HISTORY:  R & L THA  From eval: Pt reports chronic and ongoing back pain for many years. Pt states he has tried chiropractic care, acupuncture, and massage therapy. Massage therapy seems to help for a day or two but then after that it goes back to the same level of pain. Pain varies throughout the day -- most prevalent in the morning. Hurts to put on socks and shoes. Last bad flare up 2-3 months ago.   PAIN:  Are you having pain? Yes: NPRS scale: 0/10 Pain location: lower lumbar -- more on the left than right Pain description: at worst a little numbness in the L leg; discomfort Aggravating factors: Not sure what aggravates it-- but can feel it  with bending; waiting in line/prolonged standing Relieving factors: massage therapy, heat  PRECAUTIONS: None  WEIGHT BEARING RESTRICTIONS: No  FALLS:  Has patient fallen in last 6 months? 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  PATIENT GOALS: Improve mobility and pain/stiffness; return to hiking and biking  NEXT MD VISIT: n/a  OBJECTIVE:   MUSCLE LENGTH: Hamstrings: Right 90 deg; Left 90 deg Thomas test: Right 35 deg; Left 15 deg  LOWER EXTREMITY MMT:    MMT Right eval Left eval Right 1/16 Left 1/16  Hip flexion 5 5    Hip extension 3+ 3+ 4+ 4+  Hip abduction 3+ 3+ 4+ 4+  Hip adduction 3+ 3+ 4 4  Hip internal rotation 4 4    Hip external rotation 4 4    Knee flexion 4+ 4+    Knee extension 4+ 4+    Ankle dorsiflexion      Ankle plantarflexion      Ankle inversion      Ankle eversion       (Blank rows = not tested)  LUMBAR SPECIAL TESTS:  Straight leg raise test: Negative, Slump test: Negative, FABER test: Negative, and Thomas test: Positive, Knee bend (+) bilat (on L can feel on right side of back as well)  07/16/22: knee bend (-) bilat no pull into low back  [facility] Adult PT Treatment:                                                DATE: 08/05/22 Therapeutic Exercise: Supine Hip flexor stretch 2x30 sec Figure 4 stretch 2x30 sec DKTC x10 sec Piriformis stretch x 30 sec Ab set with marching x10 Ab set with knees flex/ext on pball 2x10 Bridge with LEs on physioball 2x10 Prone Hip ext x10 R&L Sidelying Hip abd x10 R&L Sitting On green pball LAQ 2x10 On green pball marching 2x10 On green pball hip circles x5 On green pball alternating UE flexion   [facility] Adult PT Treatment:                                                DATE: 07/22/22 Therapeutic  Exercise: Treadmill x 5 min 1.9 mph Hip flexor stretch 2 x 30 seconds Bridge with LEs on physioball Ab set with marching x 10 Ab set with double LE lift x 10 Bird dog 2 x 10 Deadlift 15# 2 x  10 Sidestep green TB 20'x 4 Pallof 10# 2 x 10 bilat Backward walking 15# x 10 Half kneeling hip flexor stretch 2 x 30 sec    PATIENT EDUCATION:  Education details: Exam findings, POC, HEP Person educated: Patient Education method: Explanation, Demonstration, and Handouts Education comprehension: verbalized understanding, returned demonstration, and needs further education  HOME EXERCISE PROGRAM: Access Code: [email or web address] URL: [email or web address] Date: 07/10/2022 Prepared by: Estill Bamberg April Thurnell Garbe  Exercises - Hip Abduction with Resistance Loop  - 1 x daily - 7 x weekly - 2 sets - 10 reps - Hip Extension with Resistance Loop  - 1 x daily - 7 x weekly - 2 sets - 10 reps - Sidelying ITB Stretch off Table  - 1 x daily - 7 x weekly - 2 sets - 30 sec hold - Supine Lower Trunk Rotation  - 1 x daily - 7 x weekly - 3 sets - 10 sec hold - Bilateral Bent Leg Lift  - 1 x daily - 7 x weekly - 2 sets - 10 reps - Standing Anti-Rotation Press with Anchored Resistance  - 1 x daily - 7 x weekly - 2 sets - 10 reps - 3 sec hold  ASSESSMENT:  CLINICAL IMPRESSION: No over tightness or spasm noted with palpation today. Gentle transition back to core and hip strengthening today. Worked on core stabilization in sitting. Pt is demonstrating increase in hip strength.    GOALS: Goals reviewed with patient? Yes  SHORT TERM GOALS: Target date: 07/29/2022   Pt will be ind with initial HEP Baseline: Goal status: MET  2.  Pt will report >/=25% improvement with bending movements Baseline:  Goal status: IN PROGRESS    LONG TERM GOALS: Target date: 08/26/2022   Pt will be ind with continuing and progressing HEP Baseline:  Goal status: INITIAL  2.  Pt will be able to demonstrate good body mechanics for back safety with all lifting tasks Baseline:  Goal status: INITIAL  3.  Pt will be able to lift at least 25# with no pain to demo improved functional LE strength Baseline:  Goal  status: INITIAL  4.  Pt will be able to get bilat thighs flat on mat table during Thomas stretch to demo improved hip flexor length Baseline:  Goal status: MET  5.  Pt will be able to stand at least 20 min with >/=50% improvement in back pain for shopping Baseline:  Goal status: INITIAL  6.  Pt will have improved FOTO score to >/=61 Baseline:  Goal status: INITIAL  PLAN:  PT FREQUENCY: 2x/week  PT DURATION: 8 weeks  PLANNED INTERVENTIONS: Therapeutic exercises, Therapeutic activity, Neuromuscular re-education, Balance training, Gait training, Patient/Family education, Self Care, Joint mobilization, Stair training, Aquatic Therapy, Dry Needling, Electrical stimulation, Spinal mobilization, Cryotherapy, Moist heat, Taping, Ultrasound, Ionotophoresis 4mg /ml Dexamethasone, and Manual therapy.  PLAN FOR NEXT SESSION: Glute and core strength! Assess response to HEP. Manual work as indicated for lumbar/sacrum/hip flexors. Continue to stretch psoas/quads/hip flexors.   Kanon Novosel April Ma L Valeta Paz, PT 08/05/2022, 11:44 AM

## 2022-09-25 ENCOUNTER — Encounter: Payer: Self-pay | Admitting: Radiology

## 2023-01-01 ENCOUNTER — Encounter: Payer: Self-pay | Admitting: Orthopaedic Surgery

## 2023-01-01 ENCOUNTER — Ambulatory Visit (INDEPENDENT_AMBULATORY_CARE_PROVIDER_SITE_OTHER): Payer: 59 | Admitting: Orthopaedic Surgery

## 2023-01-01 DIAGNOSIS — M79644 Pain in right finger(s): Secondary | ICD-10-CM | POA: Diagnosis not present

## 2023-01-01 MED ORDER — CELECOXIB 200 MG PO CAPS: 200.0000 mg | ORAL_CAPSULE | Freq: Two times a day (BID) | ORAL | 1 refills | Status: AC | PRN
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
## 2023-01-07 ENCOUNTER — Other Ambulatory Visit (INDEPENDENT_AMBULATORY_CARE_PROVIDER_SITE_OTHER): Payer: 59

## 2023-01-07 ENCOUNTER — Ambulatory Visit (INDEPENDENT_AMBULATORY_CARE_PROVIDER_SITE_OTHER): Payer: 59 | Admitting: Orthopaedic Surgery

## 2023-01-07 DIAGNOSIS — M25552 Pain in left hip: Secondary | ICD-10-CM

## 2023-01-07 DIAGNOSIS — M79644 Pain in right finger(s): Secondary | ICD-10-CM
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
